# Patient Record
Sex: Male | Born: 1970 | Race: Black or African American | Hispanic: No | Smoking: Current every day smoker
Health system: Southern US, Community
[De-identification: ages and names within clinical notes are randomized; demographics above are authoritative.]

## PROBLEM LIST (undated history)

## (undated) DIAGNOSIS — K469 Unspecified abdominal hernia without obstruction or gangrene: Secondary | ICD-10-CM

## (undated) DIAGNOSIS — J45909 Unspecified asthma, uncomplicated: Secondary | ICD-10-CM

---

## 2015-04-19 ENCOUNTER — Encounter (HOSPITAL_COMMUNITY): Payer: Self-pay | Admitting: Emergency Medicine

## 2015-04-19 ENCOUNTER — Emergency Department (HOSPITAL_COMMUNITY)
Admission: EM | Admit: 2015-04-19 | Discharge: 2015-04-19 | Disposition: A | Discharge status: Home / Self Care | Payer: Self-pay | Attending: Emergency Medicine | Admitting: Emergency Medicine

## 2015-04-19 DIAGNOSIS — J45909 Unspecified asthma, uncomplicated: Secondary | ICD-10-CM

## 2015-04-19 DIAGNOSIS — J45901 Unspecified asthma with (acute) exacerbation: Secondary | ICD-10-CM | POA: Insufficient documentation

## 2015-04-19 HISTORY — DX: Unspecified asthma, uncomplicated: J45.909

## 2015-04-19 MED ORDER — ALBUTEROL SULFATE HFA 108 (90 BASE) MCG/ACT IN AERS
2.0000 | INHALATION_SPRAY | Freq: Once | RESPIRATORY_TRACT | Status: AC
  Administered 2015-04-19: 2 via RESPIRATORY_TRACT
  Filled 2015-04-19: qty 6.7

## 2015-04-19 MED ORDER — IPRATROPIUM-ALBUTEROL 0.5-2.5 (3) MG/3ML IN SOLN
3.0000 mL | Freq: Once | RESPIRATORY_TRACT | Status: AC
  Administered 2015-04-19: 3 mL via RESPIRATORY_TRACT
  Filled 2015-04-19: qty 3

## 2015-04-19 NOTE — Discharge Instructions (Signed)
1. Medications: albuterol inhaler, usual home medications 2. Treatment: rest, drink plenty of fluids 3. Follow Up: please followup with your primary doctor within the next week for discussion of your diagnoses and further evaluation after today's visit; if you do not have a primary care doctor use the resource guide provided to find one; please return to the ER for high fever, shortness of breath, chest pain, new or worsening symptoms   Asthma, Adult Asthma is a condition of the lungs in which the airways tighten and narrow. Asthma can make it hard to breathe. Asthma cannot be cured, but medicine and lifestyle changes can help control it. Asthma may be started (triggered) by:  Animal skin flakes (dander).  Dust.  Cockroaches.  Pollen.  Mold.  Smoke.  Cleaning products.  Hair sprays or aerosol sprays.  Paint fumes or strong smells.  Cold air, weather changes, and winds.  Crying or laughing hard.  Stress.  Certain medicines or drugs.  Foods, such as dried fruit, potato chips, and sparkling grape juice.  Infections or conditions (colds, flu).  Exercise.  Certain medical conditions or diseases.  Exercise or tiring activities. HOME CARE   Take medicine as told by your doctor.  Use a peak flow meter as told by your doctor. A peak flow meter is a tool that measures how well the lungs are working.  Record and keep track of the peak flow meter's readings.  Understand and use the asthma action plan. An asthma action plan is a written plan for taking care of your asthma and treating your attacks.  To help prevent asthma attacks:  Do not smoke. Stay away from secondhand smoke.  Change your heating and air conditioning filter often.  Limit your use of fireplaces and wood stoves.  Get rid of pests (such as roaches and mice) and their droppings.  Throw away plants if you see mold on them.  Clean your floors. Dust regularly. Use cleaning products that do not  smell.  Have someone vacuum when you are not home. Use a vacuum cleaner with a HEPA filter if possible.  Replace carpet with wood, tile, or vinyl flooring. Carpet can trap animal skin flakes and dust.  Use allergy-proof pillows, mattress covers, and box spring covers.  Wash bed sheets and blankets every week in hot water and dry them in a dryer.  Use blankets that are made of polyester or cotton.  Clean bathrooms and kitchens with bleach. If possible, have someone repaint the walls in these rooms with mold-resistant paint. Keep out of the rooms that are being cleaned and painted.  Wash hands often. GET HELP IF:  You have make a whistling sound when breaking (wheeze), have shortness of breath, or have a cough even if taking medicine to prevent attacks.  The colored mucus you cough up (sputum) is thicker than usual.  The colored mucus you cough up changes from clear or white to yellow, green, gray, or bloody.  You have problems from the medicine you are taking such as:  A rash.  Itching.  Swelling.  Trouble breathing.  You need reliever medicines more than 2-3 times a week.  Your peak flow measurement is still at 50-79% of your personal best after following the action plan for 1 hour.  You have a fever. GET HELP RIGHT AWAY IF:   You seem to be worse and are not responding to medicine during an asthma attack.  You are short of breath even at rest.  You get short  of breath when doing very little activity.  You have trouble eating, drinking, or talking.  You have chest pain.  You have a fast heartbeat.  Your lips or fingernails start to turn blue.  You are light-headed, dizzy, or faint.  Your peak flow is less than 50% of your personal best.   This information is not intended to replace advice given to you by your health care provider. Make sure you discuss any questions you have with your health care provider.   Document Released: 12/19/2007 Document Revised:  03/23/2015 Document Reviewed: 01/29/2013 Elsevier Interactive Patient Education 2016 [employer].    Emergency Department Resource Guide 1) Find a Doctor and Pay Out of Pocket Although you won't have to find out who is covered by your insurance plan, it is a good idea to ask around and get recommendations. You will then need to call the office and see if the doctor you have chosen will accept you as a new patient and what types of options they offer for patients who are self-pay. Some doctors offer discounts or will set up payment plans for their patients who do not have insurance, but you will need to ask so you aren't surprised when you get to your appointment.  2) Contact Your Local Health Department Not all health departments have doctors that can see patients for sick visits, but many do, so it is worth a call to see if yours does. If you don't know where your local health department is, you can check in your phone book. The CDC also has a tool to help you locate your state's health department, and many state websites also have listings of all of their local health departments.  3) Find a Walk-in Clinic If your illness is not likely to be very severe or complicated, you may want to try a walk in clinic. These are popping up all over the country in pharmacies, drugstores, and shopping centers. They're usually staffed by nurse practitioners or physician assistants that have been trained to treat common illnesses and complaints. They're usually fairly quick and inexpensive. However, if you have serious medical issues or chronic medical problems, these are probably not your best option.  No Primary Care Doctor: - Call Health Connect at  [phone] - they can help you locate a primary care doctor that  accepts your insurance, provides certain services, etc. - Physician Referral Service- [phone]  Chronic Pain Problems: Organization         Address  Phone   Notes  [facility]  [phone] Patients need to be referred by their primary care doctor.   Medication Assistance: Organization         Address  Phone   Notes  [facility] Medication [facility] [address] [place], [place] [address] [phone] --Must be a resident of [facility] -- Must have NO insurance coverage whatsoever (no Medicaid/ Medicare, etc.) -- The pt. MUST have a primary care doctor that directs their care regularly and follows them in the community   [facility]  [phone]   [employer]  [phone]    Agencies that provide inexpensive medical care: Organization         Address  Phone   Notes  [facility]  [phone]   [facility]    [phone]   [facility] [address] [place], [place] [address] [phone]   [facility] [place] [address] [place] [place] [phone]   Planned Parenthood    [phone]   [facility]    [phone]   [facility]  [address], [place] Phone:  [phone], Fax:  [phone] Hours of Operation:  9 am - 6 pm, M-F.  Also accepts Medicaid/Medicare and self-pay.  [facility]  [address], [place] Phone: [phone], Fax: [phone]. Hours of Operation:  8:30 am - 5:30 pm, M-F.  Also accepts Medicaid and self-pay.  [facility] [place] [address], [place] Phone: [phone]   [facility] [address] Natasha Bence [place], [place] [phone], Ext. [phone] Mondays & Thursdays: 7-9 AM.  First 15 patients are seen on a first come, first serve basis.    Medicaid-accepting [facility] Providers:  Organization         Address  Phone   Notes  [facility] [address],  [phone] Also accepts self-pay patients.  [facility] [address] Laurell Josephs [place],  [place]  [phone]   [facility] [address], [place] [phone]   [facility] [address], [place] [phone]   Renaye Rakers [address], [place]   [phone] Only accepts [place] Access [place] patients after they have their name applied to their card.   Self-Pay (no insurance) in [facility]:  Organization         Address  Phone   Notes  Sickle Cell Patients, [facility] Medicine [address] [place], [place] [phone]   [facility] [address] [place], [place] [phone]   [facility] [place]  [address], [place] [phone]   [facility]/Dr. Osei-Bonsu  [address] [place] or [address], [place] [phone] Phone number for both [place] and [place] locations is the same.  Urgent Medical and [facility] [address], [place] [phone]   [facility] [address] [place] or [address] [phone]   [facility] [address], [place] [phone], phone; [phone], fax Sees patients 1st and 3rd Saturday of every month.  Must not qualify for public or private insurance (i.e. Medicaid, Medicare, [place] Health Choice, Veterans' Benefits)  Household income should be no more than 200% of the poverty level The clinic cannot treat you if you are pregnant or think you are pregnant  Sexually transmitted diseases are not treated at the clinic.    Dental Care: Organization         Address  Phone  Notes  [facility] of [facility] [address] [place], [place] [phone] Accepts children up to age 69 who are enrolled in [place] or [place] Health Choice; pregnant women with a Medicaid card; and children who have applied for Medicaid or [place] Health  Choice, but were declined, whose parents can pay a reduced fee at time of service.  [facility] Department of [facility]  [address], [place] [phone] Accepts children up to age 80 who are enrolled in [place] or [place] Health Choice; pregnant women with a Medicaid card; and children  who have applied for Medicaid or [place] Health Choice, but were declined, whose parents can pay a reduced fee at time of service.  [facility]  [address] [place], [place] [phone] Patients are seen by appointment only. Walk-ins are not accepted. [facility] will see patients 35 years of age and older. Monday - Tuesday (8am-5pm) Most Wednesdays (8:30-5pm) $30 per visit, cash only  [facility] Adult Dental Access PROGRAM  [address], [facility] [phone] Patients are seen by appointment only. Walk-ins are not accepted. [facility] will see patients 58 years of age and older. One Wednesday Evening (Monthly: Volunteer Based).  $30 per visit, cash only  [employer]  [phone] for adults; Children under age 66, call Graduate Pediatric Dentistry at [phone]. Children aged 78-14, please call [phone] to request a pediatric application.  Dental services are provided in all areas of dental care including fillings, crowns and bridges, complete and partial dentures, implants, gum treatment, root canals, and extractions. Preventive care is also provided. Treatment is provided to both adults and children. Patients are selected via a lottery and there is often a waiting list.   [facility] [address], [place]  [phone] [email or web address]   [facility] [address] [place], [place] [phone], Ext. [phone] Second and Fourth Thursday of each month, opens at 6:30 AM; Clinic ends at 9 AM.  Patients are seen on a first-come first-served basis, and a limited number are seen during each  clinic.   [facility]  [address] [place] [address] Ether Griffins [place], [place] [phone]   Eligibility Requirements You must have lived in [place], [place], or [place] counties for at least the last three months.   You cannot be eligible for state or federal sponsored [place], including [employer], [place], or [employer].   You generally cannot be eligible for healthcare insurance through your employer.    How to apply: Eligibility screenings are held every Tuesday and Wednesday afternoon from 1:00 pm until 4:00 pm. You do not need an appointment for the interview!  [facility] [address] [place], [place] [phone]   [facility] Health Department  [phone]   [facility] Health Department  [phone]   [facility] Health Department  [phone]    Behavioral Health Resources in the Community: Intensive Outpatient Programs Organization         Address  Phone  Notes  [facility] [address] [place], [place] [phone]   [facility] Outpatient [address], [place], [place] [phone]   [facility] [address] [place], [place]  [phone]   [facility] Mental Health [address]  [place], [place] [phone] or [phone]   Substance Abuse Resources Organization         Address  Phone  Notes  [facility] Drug Services  [phone]   [facility]  [phone]   [facility] [place]  [phone]   Floydene Flock  [phone]   Residential & Outpatient Substance Abuse Program  [phone]   Psychological Services Organization         Address  Phone  Notes  [facility] Health  [phone]   [facility] Services  [phone]   [facility] Mental Health [address] [place] [phone] or [phone]    Mobile Crisis Teams Organization  Address  Phone  Notes  [facility], Mobile  Crisis Care Unit  [phone]   [facility]  [address] [place], [place]   [facility] [address], [place] [phone]    Self-Help/Support Groups Organization         Address  Phone             Notes  [place] [facility] [place] - variety of support groups  [place] Call for more information  Narcotics Anonymous (NA), [facility] [address], [employer] [place]  2 meetings at this location   Special educational needs teacher         Address  Phone  Notes  [facility] [place],    [place]  [phone]   [facility]  [address] [place] [phone], [place], [place]   [place]ael[place], [place] [phone] Admissions: 8am-3pm M-F  Incentives Substance [place] [phone],    [place], [place] [phone]   [facility] [address] [place], [place], [place]   [facility] [address],  [place], [place]   [facility] - Intensive Outpatient [address] Kristeen Mans [address], [place], [place]   [facility] [place]  [place], [place] [phone] or [phone]   [facility] ([facility]) [address], [place], [place] Accepts Medicaid  [facility] [place] [address]  [place] [phone] Substance Abuse/Addiction Treatment   [facility] Organization         Address  Phone  Notes  [facility]  [phone]   Domenic Schwab, PhD [address] Arlis Porta [place], [place]   [phone] or [phone]   [place], [place] [phone]   [facility] [address], [place], [place] [phone] Insurance/Medicaid/sponsorship through [facility] and Families [address]  [place]                                    [place], [place] [phone] [place] [address], [place] [phone]    Dr. Adele Schilder  [phone]   [facility] Dept. 1) [address], [place] 2) [place] 3)  [place] [address], [place] [phone]  [phone]   [place] [phone] or [phone] (After Hours)

## 2015-04-19 NOTE — ED Provider Notes (Signed)
CSN: [id number]     Arrival date & time 04/19/15  1609 History  By signing my name below, I, Jarvis Morgan, attest that this documentation has been prepared under the direction and in the presence of Glean Hess, [place]. Electronically Signed: Jarvis Morgan, ED Scribe. 04/19/2015. 5:43 PM.    Chief Complaint  Patient presents with  . Asthma    The history is provided by the patient. No language interpreter was used.    HPI Comments:  Omar Nelson is a 44 y.o. male with a PMH of asthma who presents to the Emergency Department complaining of asthma. He reports he was outside when his asthma attack started earlier today. He states this felt like his normal asthma attack, and reports he typically uses an albuterol inhaler for symptom relief. He reports he is currently out of his albuterol inhaler. He states he uses his inhaler daily. He reports he had a breathing treatment upon arrival to the ED, which provided significant symptom relief. He is currently asymptomatic. He denies recent illness, fever, chills, otalgia, nasal congestion, sore throat, chest pain, numbness, paresthesia, weakness, abdominal pain, nausea, vomiting, or diarrhea.     Past Medical History  Diagnosis Date  . Asthma    History reviewed. No pertinent past surgical history. No family history on file. Social History  Substance Use Topics  . Smoking status: Never Smoker   . Smokeless tobacco: None  . Alcohol Use: No      Review of Systems  Constitutional: Negative for fever and chills.  HENT: Negative for congestion and ear pain.   Respiratory: Positive for cough and shortness of breath.   Cardiovascular: Negative for chest pain.  Gastrointestinal: Negative for nausea, vomiting, abdominal pain and diarrhea.  Neurological: Negative for weakness and numbness.  All other systems reviewed and are negative.     Allergies  Review of patient's allergies indicates not on file.  Home Medications    Prior to Admission medications   Not on File   Triage Vitals: BP 132/84 mmHg  Pulse 74  Temp(Src) 98.1 F (36.7 C) (Oral)  Resp 20  SpO2 98%   Physical Exam  Constitutional: He is oriented to person, place, and time. He appears well-developed and well-nourished. No distress.  HENT:  Head: Normocephalic and atraumatic.  Right Ear: Tympanic membrane and external ear normal.  Left Ear: Tympanic membrane and external ear normal.  Nose: Nose normal.  Mouth/Throat: Uvula is midline and oropharynx is clear and moist. No posterior oropharyngeal edema or posterior oropharyngeal erythema.  Eyes: Conjunctivae and EOM are normal. Pupils are equal, round, and reactive to light. Right eye exhibits no discharge. Left eye exhibits no discharge. No scleral icterus.  Neck: Normal range of motion. Neck supple. No tracheal deviation present.  Cardiovascular: Normal rate, regular rhythm, normal heart sounds and intact distal pulses.   Pulmonary/Chest: Effort normal and breath sounds normal. No stridor. No respiratory distress. He has no wheezes. He has no rales.  Abdominal: Soft. Bowel sounds are normal. He exhibits no distension and no mass. There is no tenderness. There is no rebound and no guarding.  Musculoskeletal: Normal range of motion.  Neurological: He is alert and oriented to person, place, and time.  Skin: Skin is warm and dry. No rash noted. He is not diaphoretic. No erythema. No pallor.  Psychiatric: He has a normal mood and affect. His behavior is normal. Judgment and thought content normal.  Nursing note and vitals reviewed.   ED Course  Procedures (including  critical care time)  DIAGNOSTIC STUDIES: Oxygen Saturation is 98% on RA, normal by my interpretation.    COORDINATION OF CARE: 4:17 PM- Will order albuterol breathing treatment. Pt advised of plan for treatment and pt agrees.  5:42 PM-Will d/c pt home with albuterol inhaler to use daily. Will also provide pt with referral  to PCP. Recommended pt to return if symptoms worsen. Pt advised of plan for treatment and pt agrees.  Labs Review Labs Reviewed - No data to display  Imaging Review No results found.    EKG Interpretation None      MDM   Final diagnoses:  Asthma, unspecified asthma severity, uncomplicated    44 year old male presents with asthma. Reports asthma attack prior to arrival with associated dry cough and shortness of breath. Reports resolution of symptoms s/p breathing treatment. States his symptoms felt consistent with his typical asthma attack. Reports he ran out of his albuterol inhaler, and has not been using it for the past several days.   Patient is afebrile and well-appearing. Vital signs stable. No tachycardia. O2 sat 99% on RA. Heart RRR. Lungs clear to auscultation bilaterally with no wheezing. No respiratory distress. Abdomen soft, non-tender, non-distended.  Patient given albuterol inhaler. Feel patient is stable for discharge at this time. Do not feel any additional work-up is indicated, as the patient reports his symptoms were consistent with his typical asthma attack and resolved s/p treatment. He is currently asymptomatic. Patient to follow-up with PCP for management of asthma. Resource list given. Return precautions discussed at length. Patient is in agreement with plan.  BP 132/84 mmHg  Pulse 67  Temp(Src) 98.1 F (36.7 C) (Oral)  Resp 20  SpO2 99%      Mady Gemma, PA-C 04/20/15 1838  Lorre Nick, MD 04/21/15 [id number]

## 2015-04-19 NOTE — ED Notes (Signed)
Per pt, states asthma symptoms about a hour ago-doe not have inhaler

## 2015-09-04 ENCOUNTER — Emergency Department (HOSPITAL_COMMUNITY)
Admission: EM | Admit: 2015-09-04 | Discharge: 2015-09-04 | Disposition: A | Discharge status: Home / Self Care | Payer: Self-pay | Attending: Emergency Medicine | Admitting: Emergency Medicine

## 2015-09-04 ENCOUNTER — Encounter (HOSPITAL_COMMUNITY): Payer: Self-pay | Admitting: *Deleted

## 2015-09-04 DIAGNOSIS — J45901 Unspecified asthma with (acute) exacerbation: Secondary | ICD-10-CM | POA: Insufficient documentation

## 2015-09-04 DIAGNOSIS — J45909 Unspecified asthma, uncomplicated: Secondary | ICD-10-CM

## 2015-09-04 DIAGNOSIS — Z760 Encounter for issue of repeat prescription: Secondary | ICD-10-CM | POA: Insufficient documentation

## 2015-09-04 DIAGNOSIS — Z7951 Long term (current) use of inhaled steroids: Secondary | ICD-10-CM | POA: Insufficient documentation

## 2015-09-04 MED ORDER — ALBUTEROL SULFATE HFA 108 (90 BASE) MCG/ACT IN AERS
2.0000 | INHALATION_SPRAY | Freq: Once | RESPIRATORY_TRACT | Status: AC
  Administered 2015-09-04: 2 via RESPIRATORY_TRACT
  Filled 2015-09-04: qty 6.7

## 2015-09-04 MED ORDER — FLUTICASONE FUROATE-VILANTEROL 100-25 MCG/INH IN AEPB: 1.0000 | INHALATION_SPRAY | Freq: Every day | RESPIRATORY_TRACT | Status: DC

## 2015-09-04 MED ORDER — ALBUTEROL SULFATE HFA 108 (90 BASE) MCG/ACT IN AERS: 1.0000 | INHALATION_SPRAY | Freq: Four times a day (QID) | RESPIRATORY_TRACT | Status: DC | PRN

## 2015-09-04 MED ORDER — PREDNISONE 20 MG PO TABS: 40.0000 mg | ORAL_TABLET | Freq: Every day | ORAL | Status: DC

## 2015-09-04 MED ORDER — ALBUTEROL SULFATE (2.5 MG/3ML) 0.083% IN NEBU
INHALATION_SOLUTION | RESPIRATORY_TRACT | Status: AC
  Filled 2015-09-04: qty 6

## 2015-09-04 NOTE — Discharge Instructions (Signed)
Use inhalers as directed Return to ER for any new or worsening symptoms, any additional concerns.  Follow up with your primary provider in regards to today's visit. If you do not have a primary physician, see resource guide below for help.    Emergency Department Resource Guide  1) Find a Doctor and Pay Out of Pocket Although you won't have to find out who is covered by your insurance plan, it is a good idea to ask around and get recommendations. You will then need to call the office and see if the doctor you have chosen will accept you as a new patient and what types of options they offer for patients who are self-pay. Some doctors offer discounts or will set up payment plans for their patients who do not have insurance, but you will need to ask so you aren't surprised when you get to your appointment.  2) Contact Your Local Health Department Not all health departments have doctors that can see patients for sick visits, but many do, so it is worth a call to see if yours does. If you don't know where your local health department is, you can check in your phone book. The CDC also has a tool to help you locate your state's health department, and many state websites also have listings of all of their local health departments.  3) Find a Walk-in Clinic If your illness is not likely to be very severe or complicated, you may want to try a walk in clinic. These are popping up all over the country in pharmacies, drugstores, and shopping centers. They're usually staffed by nurse practitioners or physician assistants that have been trained to treat common illnesses and complaints. They're usually fairly quick and inexpensive. However, if you have serious medical issues or chronic medical problems, these are probably not your best option.  No Primary Care Doctor: - Call [facility] at  [phone]: they can help you locate a primary care doctor that  accepts your insurance, provides certain services,  etc. - Physician Referral Service: [phone]  Chronic Pain Problems: Organization         Address  Phone   Notes  [facility]  [phone] Patients need to be referred by their primary care doctor.   Medication Assistance: Organization         Address  Phone   Notes  [facility] Medication [facility] [address] [place] [place], [place] [address] [phone] --Must be a resident of [facility] -- Must have NO insurance coverage whatsoever (no Medicaid/ Medicare, etc.) -- The pt. MUST have a primary care doctor that directs their care regularly and follows them in the community   [facility]  [phone]   [employer]  [phone]    Agencies that provide inexpensive medical care: Organization         Address  Phone   Notes  [facility]  [phone]   [facility]    [phone]   [facility] [address] [place], [place] [address] [phone]   [facility] [address] [place] [place] [phone]   [facility]    [phone]   [facility]    [phone]   [facility]  [address], [place] Phone:  [phone], Fax:  [phone] Hours of Operation:  9 am - 6 pm, M-F.  Also  accepts Medicaid/Medicare and self-pay.  [facility]  [address], [place] Phone: [phone], Fax: [phone]. Hours of Operation:  8:30 am - 5:30 pm, M-F.  Also accepts Medicaid and self-pay.  [facility] [place] [address] [place] Phone: [phone]   [facility] [address] Natasha Bence [place], [place] [phone], Ext. [phone] Mondays & Thursdays: 7-9 AM.  First 15 patients are seen on a first come, first serve basis.    Medicaid-accepting [facility] Providers:  Organization         Address  Phone   Notes  [facility] [address], [place] [phone] Also accepts self-pay patients.  [facility] [address] Laurell Josephs [place], [place]  [phone]   [facility] [address], [place] [phone]   [facility] [address] [place] [phone]   Renaye Rakers [address], [place]   [phone] Only accepts [place] patients after they have their name applied to their card.   Self-Pay (no insurance) in [facility]:  Organization         Address  Phone   Notes  Sickle Cell Patients, [facility] [address] [place], [place] [phone]   [facility] [address] [place], [place] [phone]   [facility] [place]  [address], [place] [phone]   [facility]/Dr. Osei-Bonsu  [address] [place] or [address], [place] [phone] Phone number for both [place] and [facility] locations is the same.  [facility] [address] [place] [phone]   [facility] [address], [place] or [address] [phone]   [facility] [address], [place] [phone], phone; [phone], fax Sees patients 1st and 3rd Saturday of every month.  Must not qualify for public or private insurance (i.e. Medicaid, Medicare, [place] Health Choice, Veterans' Benefits)  Household income should be no more than 200% of the poverty level The clinic cannot treat you if you are pregnant or think you are pregnant  Sexually transmitted diseases are not treated at the clinic.

## 2015-09-04 NOTE — ED Provider Notes (Signed)
CSN: [id number]     Arrival date & time 09/04/15  1048 History  By signing my name below, I, Arianna Nassar, attest that this documentation has been prepared under the direction and in the presence of [employer], PA-C. Electronically Signed: Octavia Heir, ED Scribe. 09/04/2015. 11:11 AM.    Chief Complaint  Patient presents with  . Cough  . Medication Refill      The history is provided by the patient. No language interpreter was used.   HPI Comments: Omar Nelson is a 45 y.o. male who presents to the Emergency Department presenting with a medication refill for his asthma. Pt presents with dry cough and wheezing last night onset last night due to running out of his Breo steroid inhaler. He states he uses his Breo and his rescue inhaler to help with his symptoms but has not hasn't used them today. Denies fever.  Past Medical History  Diagnosis Date  . Asthma    History reviewed. No pertinent past surgical history. No family history on file. Social History  Substance Use Topics  . Smoking status: Never Smoker   . Smokeless tobacco: None  . Alcohol Use: No    Review of Systems  Constitutional: Negative for fever.  HENT: Negative for congestion.   Respiratory: Positive for cough and wheezing.   Cardiovascular: Negative for chest pain.      Allergies  Review of patient's allergies indicates no known allergies.  Home Medications   Prior to Admission medications   Medication Sig Start Date End Date Taking? Authorizing Provider  fluticasone furoate-vilanterol (BREO ELLIPTA) 100-25 MCG/INH AEPB Inhale 1 puff into the lungs daily. 09/04/15   Chase Picket Oswell Say, PA-C  predniSONE (DELTASONE) 20 MG tablet Take 2 tablets (40 mg total) by mouth daily. 09/04/15   Chase Picket Aury Scollard, PA-C   Triage vitals: BP 147/98 mmHg  Pulse 71  Temp(Src) 98.4 F (36.9 C) (Oral)  Resp 18  SpO2 96% Physical Exam  Constitutional: He is oriented to person, place, and time. He appears  well-developed and well-nourished.  HENT:  Head: Normocephalic and atraumatic.  Neck: Normal range of motion. Neck supple.  Cardiovascular: Normal rate, regular rhythm and normal heart sounds.   Pulmonary/Chest: Effort normal. No respiratory distress. He has wheezes (Fainte expiratory wheezing bilaterally). He has no rales.  Abdominal: Soft. He exhibits no distension. There is no tenderness.  Musculoskeletal: He exhibits no edema.  Neurological: He is alert and oriented to person, place, and time. Coordination normal.  Skin: Skin is warm and dry. No rash noted. He is not diaphoretic.  Psychiatric: He has a normal mood and affect.  Nursing note and vitals reviewed.   ED Course  Procedures  DIAGNOSTIC STUDIES: Oxygen Saturation is 96% on RA, normal by my interpretation.  COORDINATION OF CARE:  11:10 AM Discussed treatment plan which includes Brio inhaler and albuterol inhaler with pt at bedside and pt agreed to plan.  Labs Review Labs Reviewed - No data to display  Imaging Review No results found. I have personally reviewed and evaluated these images and lab results as part of my medical decision-making.   EKG Interpretation None      MDM   Final diagnoses:  Asthma, unspecified asthma severity, uncomplicated   Omar Nelson presents for medication refill of asthma inhalers. Mild wheezing on exam - patient declines neb treatment stating that "it doesn't work" and he "will not take it" Patient given rx for ICS inhaler and albuterol along with steroid burst. PCP resource  guide provided. Return precautions discussed.   I personally performed the services described in this documentation, which was scribed in my presence. The recorded information has been reviewed and is accurate.  [facility] Trevonte Ashkar, PA-C 09/04/15 1128  Arby Barrette, MD 09/04/15 [phone]

## 2015-09-04 NOTE — ED Notes (Signed)
Pt reports being out of his Brio for his asthma. Pt reports cough/wheezing since last night. Pt offered albuterol in triage but refused

## 2015-09-04 NOTE — ED Notes (Signed)
Pt walked to FT 3 without difficulty. "I can walk!"

## 2015-10-05 ENCOUNTER — Emergency Department (HOSPITAL_COMMUNITY)
Admission: EM | Admit: 2015-10-05 | Discharge: 2015-10-05 | Disposition: A | Discharge status: Home / Self Care | Payer: Self-pay | Attending: Emergency Medicine | Admitting: Emergency Medicine

## 2015-10-05 ENCOUNTER — Encounter (HOSPITAL_COMMUNITY): Payer: Self-pay | Admitting: Family Medicine

## 2015-10-05 DIAGNOSIS — F419 Anxiety disorder, unspecified: Secondary | ICD-10-CM | POA: Insufficient documentation

## 2015-10-05 DIAGNOSIS — J45991 Cough variant asthma: Secondary | ICD-10-CM

## 2015-10-05 DIAGNOSIS — Z760 Encounter for issue of repeat prescription: Secondary | ICD-10-CM

## 2015-10-05 DIAGNOSIS — Z7952 Long term (current) use of systemic steroids: Secondary | ICD-10-CM | POA: Insufficient documentation

## 2015-10-05 DIAGNOSIS — Z79899 Other long term (current) drug therapy: Secondary | ICD-10-CM | POA: Insufficient documentation

## 2015-10-05 MED ORDER — LORATADINE 10 MG PO TABS: 10.0000 mg | ORAL_TABLET | Freq: Every day | ORAL | Status: DC

## 2015-10-05 MED ORDER — FLUTICASONE FUROATE-VILANTEROL 100-25 MCG/INH IN AEPB: 1.0000 | INHALATION_SPRAY | Freq: Every day | RESPIRATORY_TRACT | Status: DC

## 2015-10-05 MED ORDER — ALBUTEROL SULFATE HFA 108 (90 BASE) MCG/ACT IN AERS: 1.0000 | INHALATION_SPRAY | Freq: Four times a day (QID) | RESPIRATORY_TRACT | Status: AC | PRN

## 2015-10-05 MED ORDER — ALBUTEROL SULFATE (2.5 MG/3ML) 0.083% IN NEBU
5.0000 mg | INHALATION_SOLUTION | Freq: Once | RESPIRATORY_TRACT | Status: AC
  Administered 2015-10-05: 5 mg via RESPIRATORY_TRACT

## 2015-10-05 MED ORDER — ALBUTEROL SULFATE (2.5 MG/3ML) 0.083% IN NEBU
INHALATION_SOLUTION | RESPIRATORY_TRACT | Status: AC
  Filled 2015-10-05: qty 3

## 2015-10-05 NOTE — ED Notes (Signed)
Pt being very rude sts "I just need my asthma". Refusing temperature.

## 2015-10-05 NOTE — ED Notes (Signed)
Patient being very rude and aggressive towards staff stating "I don't need none of that. I just need my asthma. That's all I need"; refused temperature being taken and vitals until instructed that the doctor will need to know what his vitals are to treat him; patient complied to vitals only

## 2015-10-05 NOTE — Discharge Instructions (Signed)
Asthma, Adult Asthma is a recurring condition in which the airways tighten and narrow. Asthma can make it difficult to breathe. It can cause coughing, wheezing, and shortness of breath. Asthma episodes, also called asthma attacks, range from minor to life-threatening. Asthma cannot be cured, but medicines and lifestyle changes can help control it. CAUSES Asthma is believed to be caused by inherited (genetic) and environmental factors, but its exact cause is unknown. Asthma may be triggered by allergens, lung infections, or irritants in the air. Asthma triggers are different for each person. Common triggers include:   Animal dander.  Dust mites.  Cockroaches.  Pollen from trees or grass.  Mold.  Smoke.  Air pollutants such as dust, household cleaners, hair sprays, aerosol sprays, paint fumes, strong chemicals, or strong odors.  Cold air, weather changes, and winds (which increase molds and pollens in the air).  Strong emotional expressions such as crying or laughing hard.  Stress.  Certain medicines (such as aspirin) or types of drugs (such as beta-blockers).  Sulfites in foods and drinks. Foods and drinks that may contain sulfites include dried fruit, potato chips, and sparkling grape juice.  Infections or inflammatory conditions such as the flu, a cold, or an inflammation of the nasal membranes (rhinitis).  Gastroesophageal reflux disease (GERD).  Exercise or strenuous activity. SYMPTOMS Symptoms may occur immediately after asthma is triggered or many hours later. Symptoms include:  Wheezing.  Excessive nighttime or early morning coughing.  Frequent or severe coughing with a common cold.  Chest tightness.  Shortness of breath. DIAGNOSIS  The diagnosis of asthma is made by a review of your medical history and a physical exam. Tests may also be performed. These may include:  Lung function studies. These tests show how much air you breathe in and out.  Allergy  tests.  Imaging tests such as X-rays. TREATMENT  Asthma cannot be cured, but it can usually be controlled. Treatment involves identifying and avoiding your asthma triggers. It also involves medicines. There are 2 classes of medicine used for asthma treatment:   Controller medicines. These prevent asthma symptoms from occurring. They are usually taken every day.  Reliever or rescue medicines. These quickly relieve asthma symptoms. They are used as needed and provide short-term relief. Your health care provider will help you create an asthma action plan. An asthma action plan is a written plan for managing and treating your asthma attacks. It includes a list of your asthma triggers and how they may be avoided. It also includes information on when medicines should be taken and when their dosage should be changed. An action plan may also involve the use of a device called a peak flow meter. A peak flow meter measures how well the lungs are working. It helps you monitor your condition. HOME CARE INSTRUCTIONS   Take medicines only as directed by your health care provider. Speak with your health care provider if you have questions about how or when to take the medicines.  Use a peak flow meter as directed by your health care provider. Record and keep track of readings.  Understand and use the action plan to help minimize or stop an asthma attack without needing to seek medical care.  Control your home environment in the following ways to help prevent asthma attacks:  Do not smoke. Avoid being exposed to secondhand smoke.  Change your heating and air conditioning filter regularly.  Limit your use of fireplaces and wood stoves.  Get rid of pests (such as roaches   and mice) and their droppings.  Throw away plants if you see mold on them.  Clean your floors and dust regularly. Use unscented cleaning products.  Try to have someone else vacuum for you regularly. Stay out of rooms while they are  being vacuumed and for a short while afterward. If you vacuum, use a dust mask from a hardware store, a double-layered or microfilter vacuum cleaner bag, or a vacuum cleaner with a HEPA filter.  Replace carpet with wood, tile, or vinyl flooring. Carpet can trap dander and dust.  Use allergy-proof pillows, mattress covers, and box spring covers.  Wash bed sheets and blankets every week in hot water and dry them in a dryer.  Use blankets that are made of polyester or cotton.  Clean bathrooms and kitchens with bleach. If possible, have someone repaint the walls in these rooms with mold-resistant paint. Keep out of the rooms that are being cleaned and painted.  Wash hands frequently. SEEK MEDICAL CARE IF:   You have wheezing, shortness of breath, or a cough even if taking medicine to prevent attacks.  The colored mucus you cough up (sputum) is thicker than usual.  Your sputum changes from clear or white to yellow, green, gray, or bloody.  You have any problems that may be related to the medicines you are taking (such as a rash, itching, swelling, or trouble breathing).  You are using a reliever medicine more than 2-3 times per week.  Your peak flow is still at 50-79% of your personal best after following your action plan for 1 hour.  You have a fever. SEEK IMMEDIATE MEDICAL CARE IF:   You seem to be getting worse and are unresponsive to treatment during an asthma attack.  You are short of breath even at rest.  You get short of breath when doing very little physical activity.  You have difficulty eating, drinking, or talking due to asthma symptoms.  You develop chest pain.  You develop a fast heartbeat.  You have a bluish color to your lips or fingernails.  You are light-headed, dizzy, or faint.  Your peak flow is less than 50% of your personal best.   This information is not intended to replace advice given to you by your health care provider. Make sure you discuss any  questions you have with your health care provider.   Document Released: 07/02/2005 Document Revised: 03/23/2015 Document Reviewed: 01/29/2013 Elsevier Interactive Patient Education 2016 Elsevier Inc.  

## 2015-10-05 NOTE — ED Provider Notes (Signed)
CSN: [id number]     Arrival date & time 10/05/15  1145 History   First MD Initiated Contact with Patient 10/05/15 1241     Chief Complaint  Patient presents with  . Asthma   HPI Comments: 45 year old male presents for med refill. He states he has had asthma for 25 years when a "doctor did a procedure on him". Pt states he has been getting his med refills here in the ED. He also states he has seasonal allergies which causes worsening of his asthma symptoms. He states he has been coughing, wheezing, and been SOB. Pt has been aggressive with staff in the waiting room, refusing breathing treatments, and refusing vitals. He has been talking in full sentences. He denies recent illness, fever, chills, URI symptoms, chest pain, abdominal pain. His last visit was 2/19 when also refused treatment until he received an albuterol inhaler.   The history is provided by the patient.    Past Medical History  Diagnosis Date  . Asthma    History reviewed. No pertinent past surgical history. History reviewed. No pertinent family history. Social History  Substance Use Topics  . Smoking status: Never Smoker   . Smokeless tobacco: None  . Alcohol Use: No    Review of Systems  Constitutional: Negative for fever and chills.  HENT: Negative for congestion.   Respiratory: Positive for cough, shortness of breath and wheezing.   Cardiovascular: Negative for chest pain.  Neurological: Positive for dizziness.    Allergies  Review of patient's allergies indicates no known allergies.  Home Medications   Prior to Admission medications   Medication Sig Start Date End Date Taking? Authorizing Provider  albuterol (PROVENTIL HFA;VENTOLIN HFA) 108 (90 Base) MCG/ACT inhaler Inhale 1-2 puffs into the lungs every 6 (six) hours as needed for wheezing or shortness of breath. 10/05/15   Bethel Born, PA-C  fluticasone furoate-vilanterol (BREO ELLIPTA) 100-25 MCG/INH AEPB Inhale 1 puff into the lungs daily. 10/05/15    Bethel Born, PA-C  loratadine (CLARITIN) 10 MG tablet Take 1 tablet (10 mg total) by mouth daily. 10/05/15   Bethel Born, PA-C  predniSONE (DELTASONE) 20 MG tablet Take 2 tablets (40 mg total) by mouth daily. 09/04/15   Jaime Pilcher Ward, PA-C   BP 130/91 mmHg  Pulse 84  Temp(Src)   Resp 18  SpO2 95%   Physical Exam  Constitutional: He is oriented to person, place, and time. He appears well-developed and well-nourished. No distress.  HENT:  Head: Normocephalic and atraumatic.  Eyes: Pupils are equal, round, and reactive to light. Right eye exhibits no discharge. Left eye exhibits no discharge. No scleral icterus.  Pulmonary/Chest: Effort normal. No accessory muscle usage. No tachypnea. No respiratory distress. He has no decreased breath sounds. He has no wheezes. He has no rhonchi. He has no rales.  Neurological: He is alert and oriented to person, place, and time.  Skin: Skin is warm and dry.  Psychiatric: His speech is normal. His mood appears anxious. His affect is angry, labile and inappropriate. He is agitated, aggressive, hyperactive and combative. Thought content is delusional. He expresses impulsivity and inappropriate judgment. He expresses no homicidal and no suicidal ideation.    ED Course  Procedures (including critical care time)   MDM   Final diagnoses:  Cough variant asthma  Medication refill   45 year old male presents for med refill. Pt is in NAD; he is yelling at staff in full sentences. No retractions noted. Pt  offered nebulizer treatments multiple times despite clear lung sounds and pt refused saying it was not the same medicine. Pt was provided a prescription for all of his medicines but pt is refusing them saying he wants them handed to him here in the ED because he does not have money to afford meds. Security was called to escort patient out. Once pt was informed he would not be given medication in the ED he is complaining of nausea and dizziness and  is repeatedly throwing himself on the floor but continuing to refuse treatment.    Bethel BornKelly Marie Hadli Vandemark, PA-C 10/05/15 1409  Bethel BornKelly Marie Jarika Robben, PA-C 10/05/15 1410  Blane OharaJoshua Zavitz, MD 10/05/15 [phone]

## 2015-10-05 NOTE — ED Notes (Signed)
Patient became angry when I went to give him discharge papers.  Patient states "my nausea makes me dizzy".   Patient was in NAD.  Patient talking in full sentences and then states "I can't afford my medicine.  So I am going to pass out when I get outside".  Patient refused to leave.  Patient yelling at staff.   Security called to help with patient.   Patient threw himself on the floor stating "I cant afford it, I have to get that Dr.'s name so I can sue".  I offered, as well as two Tax advisersecurity officers, and two techs, to help patient up.  Patient refused.   Patient threw himself on the floor again.   No injuries.   This WAS NOT a FALL.  Patient continued to yell all the way out with security.

## 2015-10-05 NOTE — ED Notes (Signed)
Pt here for asthma sts he needs a breathing treatment. Pt speaking in full sentences.

## 2016-02-04 ENCOUNTER — Emergency Department (HOSPITAL_COMMUNITY): Payer: Self-pay

## 2016-02-04 ENCOUNTER — Inpatient Hospital Stay (HOSPITAL_COMMUNITY)
Admission: EM | Admit: 2016-02-04 | Discharge: 2016-02-07 | DRG: 885 | Disposition: A | Discharge status: Home / Self Care | Payer: Self-pay | Attending: Internal Medicine | Admitting: Internal Medicine

## 2016-02-04 ENCOUNTER — Encounter (HOSPITAL_COMMUNITY): Payer: Self-pay | Admitting: Emergency Medicine

## 2016-02-04 DIAGNOSIS — N19 Unspecified kidney failure: Secondary | ICD-10-CM

## 2016-02-04 DIAGNOSIS — M6282 Rhabdomyolysis: Secondary | ICD-10-CM | POA: Diagnosis present

## 2016-02-04 DIAGNOSIS — F12950 Cannabis use, unspecified with psychotic disorder with delusions: Secondary | ICD-10-CM

## 2016-02-04 DIAGNOSIS — E872 Acidosis: Secondary | ICD-10-CM | POA: Diagnosis present

## 2016-02-04 DIAGNOSIS — F29 Unspecified psychosis not due to a substance or known physiological condition: Secondary | ICD-10-CM | POA: Diagnosis present

## 2016-02-04 DIAGNOSIS — J45909 Unspecified asthma, uncomplicated: Secondary | ICD-10-CM | POA: Diagnosis present

## 2016-02-04 DIAGNOSIS — F1220 Cannabis dependence, uncomplicated: Secondary | ICD-10-CM | POA: Diagnosis present

## 2016-02-04 DIAGNOSIS — F23 Brief psychotic disorder: Secondary | ICD-10-CM | POA: Diagnosis present

## 2016-02-04 DIAGNOSIS — F24 Shared psychotic disorder: Principal | ICD-10-CM | POA: Diagnosis present

## 2016-02-04 DIAGNOSIS — G9340 Encephalopathy, unspecified: Secondary | ICD-10-CM | POA: Diagnosis present

## 2016-02-04 DIAGNOSIS — F17200 Nicotine dependence, unspecified, uncomplicated: Secondary | ICD-10-CM | POA: Diagnosis present

## 2016-02-04 DIAGNOSIS — N179 Acute kidney failure, unspecified: Secondary | ICD-10-CM | POA: Diagnosis present

## 2016-02-04 LAB — CBC WITH DIFFERENTIAL/PLATELET
BASOS ABS: 0 10*3/uL (ref 0.0–0.1)
BASOS PCT: 0 %
EOS ABS: 0 10*3/uL (ref 0.0–0.7)
EOS PCT: 0 %
HEMATOCRIT: 44.4 % (ref 39.0–52.0)
Hemoglobin: 15 g/dL (ref 13.0–17.0)
LYMPHS ABS: 1.1 10*3/uL (ref 0.7–4.0)
Lymphocytes Relative: 10 %
MCH: 31.5 pg (ref 26.0–34.0)
MCHC: 33.8 g/dL (ref 30.0–36.0)
MCV: 93.3 fL (ref 78.0–100.0)
MONOS PCT: 8 %
Monocytes Absolute: 0.9 10*3/uL (ref 0.1–1.0)
NEUTROS PCT: 82 %
Neutro Abs: 9.4 10*3/uL — ABNORMAL HIGH (ref 1.7–7.7)
PLATELETS: 313 10*3/uL (ref 150–400)
RBC: 4.76 MIL/uL (ref 4.22–5.81)
RDW: 13 % (ref 11.5–15.5)
WBC: 11.4 10*3/uL — AB (ref 4.0–10.5)

## 2016-02-04 LAB — BLOOD GAS, VENOUS
ACID-BASE DEFICIT: 13.5 mmol/L — AB (ref 0.0–2.0)
Bicarbonate: 11 mEq/L — ABNORMAL LOW (ref 20.0–24.0)
Drawn by: 36787
O2 Saturation: 90.3 %
PCO2 VEN: 23 mmHg — AB (ref 45.0–50.0)
PO2 VEN: 71.5 mmHg — AB (ref 31.0–45.0)
Patient temperature: 98.6
TCO2: 9.9 mmol/L (ref 0–100)
pH, Ven: 7.302 — ABNORMAL HIGH (ref 7.250–7.300)

## 2016-02-04 LAB — URINALYSIS, ROUTINE W REFLEX MICROSCOPIC
GLUCOSE, UA: NEGATIVE mg/dL
Ketones, ur: NEGATIVE mg/dL
LEUKOCYTES UA: NEGATIVE
Nitrite: NEGATIVE
PH: 5 (ref 5.0–8.0)
PROTEIN: 30 mg/dL — AB
Specific Gravity, Urine: 1.018 (ref 1.005–1.030)

## 2016-02-04 LAB — COMPREHENSIVE METABOLIC PANEL
ALT: 39 U/L (ref 17–63)
AST: 136 U/L — ABNORMAL HIGH (ref 15–41)
Albumin: 5.8 g/dL — ABNORMAL HIGH (ref 3.5–5.0)
Alkaline Phosphatase: 67 U/L (ref 38–126)
Anion gap: 26 — ABNORMAL HIGH (ref 5–15)
BILIRUBIN TOTAL: 2.1 mg/dL — AB (ref 0.3–1.2)
BUN: 64 mg/dL — AB (ref 6–20)
CHLORIDE: 99 mmol/L — AB (ref 101–111)
CO2: 14 mmol/L — ABNORMAL LOW (ref 22–32)
CREATININE: 5.57 mg/dL — AB (ref 0.61–1.24)
Calcium: 9.9 mg/dL (ref 8.9–10.3)
GFR, EST AFRICAN AMERICAN: 13 mL/min — AB (ref >60)
GFR, EST NON AFRICAN AMERICAN: 11 mL/min — AB (ref >60)
Glucose, Bld: 103 mg/dL — ABNORMAL HIGH (ref 65–99)
POTASSIUM: 4.7 mmol/L (ref 3.5–5.1)
Sodium: 139 mmol/L (ref 135–145)
TOTAL PROTEIN: 10 g/dL — AB (ref 6.5–8.1)

## 2016-02-04 LAB — RAPID URINE DRUG SCREEN, HOSP PERFORMED
Amphetamines: NOT DETECTED
BENZODIAZEPINES: NOT DETECTED
Barbiturates: NOT DETECTED
COCAINE: NOT DETECTED
Opiates: NOT DETECTED

## 2016-02-04 LAB — URINE MICROSCOPIC-ADD ON
Bacteria, UA: NONE SEEN
RBC / HPF: NONE SEEN RBC/hpf (ref 0–5)
WBC, UA: NONE SEEN WBC/hpf (ref 0–5)

## 2016-02-04 LAB — ETHANOL

## 2016-02-04 LAB — SALICYLATE LEVEL: Salicylate Lvl: <4 mg/dL (ref 2.8–30.0)

## 2016-02-04 LAB — ACETAMINOPHEN LEVEL

## 2016-02-04 LAB — CK: CK TOTAL: 5185 U/L — AB (ref 49–397)

## 2016-02-04 MED ORDER — SODIUM CHLORIDE 0.9 % IV BOLUS (SEPSIS)
1000.0000 mL | Freq: Once | INTRAVENOUS | Status: AC
Start: 2016-02-04 — End: 2016-02-04
  Administered 2016-02-04: 1000 mL via INTRAVENOUS

## 2016-02-04 MED ORDER — ZIPRASIDONE MESYLATE 20 MG IM SOLR
20.0000 mg | Freq: Once | INTRAMUSCULAR | Status: AC
  Administered 2016-02-04: 20 mg via INTRAMUSCULAR
  Filled 2016-02-04: qty 20

## 2016-02-04 MED ORDER — STERILE WATER FOR INJECTION IJ SOLN
INTRAMUSCULAR | Status: AC
  Administered 2016-02-04: 1.2 mL
  Filled 2016-02-04: qty 10
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
## 2016-02-04 NOTE — ED Notes (Signed)
Pt to CT

## 2016-02-04 NOTE — ED Notes (Signed)
CT delayed due to patient behavior. Will notify CT when patient is ready for imaging.

## 2016-02-04 NOTE — ED Notes (Signed)
Patient extremely combative. Patient given Geodon, assisted by NT Porfirio Mylar, and 3 [employer] officers. Patient is talking to people who are not in the room. Patient fixated on the number 7.

## 2016-02-04 NOTE — ED Notes (Signed)
Patient was cooperative for oral temp. 98.6

## 2016-02-04 NOTE — ED Provider Notes (Signed)
CSN: [id number]     Arrival date & time 02/04/16  1849 History   First MD Initiated Contact with Patient 02/04/16 1906     Chief Complaint  Patient presents with  . Psychosis      (Consider location/radiation/quality/duration/timing/severity/associated sxs/prior Treatment) HPI  45 year old male brought in by police after he was found running in between cars in the middle of the street. Officer states that he first heard the patient yelling and he ran up to the officer yelling "help, help". Patient is refusing to answer any questions at this time. He keeps telling me that we are recording him. He states he already has the pertinent information recorded so he does not deal the need to repeat it. Apparently he was convinced that people in the passing cars were going to kill him. He will not tell me who is out to kill him. He mentions a woman named Meriam Sprague, but will not let me talk to her or tell me why she is important. No known psychiatric disease.   Past Medical History  Diagnosis Date  . Asthma    No past surgical history on file. No family history on file. Social History  Substance Use Topics  . Smoking status: Never Smoker   . Smokeless tobacco: Not on file  . Alcohol Use: No    Review of Systems  Unable to perform ROS: Psychiatric disorder      Allergies  Review of patient's allergies indicates no known allergies.  Home Medications   Prior to Admission medications   Medication Sig Start Date End Date Taking? Authorizing Provider  albuterol (PROVENTIL HFA;VENTOLIN HFA) 108 (90 Base) MCG/ACT inhaler Inhale 1-2 puffs into the lungs every 6 (six) hours as needed for wheezing or shortness of breath. 10/05/15   Bethel Born, PA-C  fluticasone furoate-vilanterol (BREO ELLIPTA) 100-25 MCG/INH AEPB Inhale 1 puff into the lungs daily. 10/05/15   Bethel Born, PA-C  loratadine (CLARITIN) 10 MG tablet Take 1 tablet (10 mg total) by mouth daily. 10/05/15   Bethel Born,  PA-C  predniSONE (DELTASONE) 20 MG tablet Take 2 tablets (40 mg total) by mouth daily. 09/04/15   Chase Picket Ward, PA-C   Temp(Src) 98.6 F (37 C) (Oral) Physical Exam  Constitutional: He appears well-developed and well-nourished.  Patient will not answer questions. No slurred speech. Does not allow me to examine him.  HENT:  Head: Normocephalic and atraumatic.  Right Ear: External ear normal.  Left Ear: External ear normal.  Nose: Nose normal.  Eyes: Right eye exhibits no discharge. Left eye exhibits no discharge.  Cardiovascular: Tachycardia present.   Pulmonary/Chest: Effort normal.  Abdominal: He exhibits no distension.  Musculoskeletal: He exhibits no edema.  Neurological: He is alert. He is disoriented.  He is unsure of the day of the week (off by 1 day). Will not answer further orientation questions  Skin: Skin is warm. He is diaphoretic.  Psychiatric: He is agitated.  Nursing note and vitals reviewed.   ED Course  Procedures (including critical care time) Labs Review Labs Reviewed  COMPREHENSIVE METABOLIC PANEL - Abnormal; Notable for the following:       Result Value   Chloride 99 (*)    CO2 14 (*)    Glucose, Bld 103 (*)    BUN 64 (*)    Creatinine, Ser 5.57 (*)    Total Protein 10.0 (*)    Albumin 5.8 (*)    AST 136 (*)    Total Bilirubin 2.1 (*)  GFR calc non Af Amer 11 (*)    GFR calc Af Amer 13 (*)    Anion gap 26 (*)    All other components within normal limits  ACETAMINOPHEN LEVEL - Abnormal; Notable for the following:    Acetaminophen (Tylenol), Serum <10 (*)    All other components within normal limits  CBC WITH DIFFERENTIAL/PLATELET - Abnormal; Notable for the following:    WBC 11.4 (*)    Neutro Abs 9.4 (*)    All other components within normal limits  URINE RAPID DRUG SCREEN, HOSP PERFORMED - Abnormal; Notable for the following:    Tetrahydrocannabinol RESULTS UNAVAILABLE DUE TO INTERFERING SUBSTANCE (*)    All other components within  normal limits  URINALYSIS, ROUTINE W REFLEX MICROSCOPIC (NOT AT [facility] - Abnormal; Notable for the following:    APPearance CLOUDY (*)    Hgb urine dipstick LARGE (*)    Bilirubin Urine SMALL (*)    Protein, ur 30 (*)    All other components within normal limits  BLOOD GAS, VENOUS - Abnormal; Notable for the following:    pH, Ven 7.302 (*)    pCO2, Ven 23.0 (*)    pO2, Ven 71.5 (*)    Bicarbonate 11.0 (*)    Acid-base deficit 13.5 (*)    All other components within normal limits  CK - Abnormal; Notable for the following:    Total CK 5,185 (*)    All other components within normal limits  URINE MICROSCOPIC-ADD ON - Abnormal; Notable for the following:    Squamous Epithelial / LPF 0-5 (*)    All other components within normal limits  COMPREHENSIVE METABOLIC PANEL - Abnormal; Notable for the following:    CO2 16 (*)    Glucose, Bld 108 (*)    BUN 48 (*)    Creatinine, Ser 2.27 (*)    AST 155 (*)    Total Bilirubin 1.9 (*)    GFR calc non Af Amer 33 (*)    GFR calc Af Amer 38 (*)    All other components within normal limits  URINE RAPID DRUG SCREEN, HOSP PERFORMED - Abnormal; Notable for the following:    Tetrahydrocannabinol POSITIVE (*)    All other components within normal limits  CK - Abnormal; Notable for the following:    Total CK 8,996 (*)    All other components within normal limits  ETHANOL  SALICYLATE LEVEL  CBC  URINE RAPID DRUG SCREEN, HOSP PERFORMED    Imaging Review Ct Head Wo Contrast  Result Date: 02/04/2016 CLINICAL DATA:  Altered mental status.  Patient combative. EXAM: CT HEAD WITHOUT CONTRAST TECHNIQUE: Contiguous axial images were obtained from the base of the skull through the vertex without intravenous contrast. COMPARISON:  None. FINDINGS: Ventricles, cisterns and other CSF spaces are within normal. There is no mass, mass effect, shift of midline structures or acute hemorrhage. Remaining bones and soft tissues are within normal. IMPRESSION: No acute  intracranial findings. Electronically Signed   By: Elberta Fortis M.D.   On: 02/04/2016 20:53   Dg Chest Portable 1 View  Result Date: 02/04/2016 CLINICAL DATA:  Patient with altered mental status. EXAM: PORTABLE CHEST 1 VIEW COMPARISON:  None. FINDINGS: Monitoring leads overlie the patient. Normal cardiac and mediastinal contours. No consolidative pulmonary opacities. No pleural effusion or pneumothorax. IMPRESSION: No acute cardiopulmonary process. Electronically Signed   By: Annia Belt M.D.   On: 02/04/2016 19:45   I have personally reviewed and evaluated these images and  lab results as part of my medical decision-making.   EKG Interpretation  Date/Time:  Saturday February 04 2016 19:14:49 EDT Ventricular Rate:  109 PR Interval:    QRS Duration: 86 QT Interval:  348 QTC Calculation: 469 R Axis:   53 Text Interpretation:  Sinus tachycardia Probable left atrial enlargement Baseline wander in lead(s) II III aVF V3 No old tracing to compare Confirmed by Scot Shiraishi MD, Ladarrion Telfair [id number]) on 02/04/2016 8:41:40 PM       MDM   Final diagnoses:  None  Acute Psychosis Acute Renal Failure Rhabdomyolysis  Patient presents acutely altered and psychotic. IVC'd by me as he does not appear to have decision making capacity and is aggressive. Likely drug induced vs psychotic break. Given no prior history, workup obtained. CT head negative. No fevers. Calmer after geodon and does not have a stiff neck or fever to suggest CNS infection. Does have renal failure with Cr of 5, no known history. Likely from running in heat and rhabdo given elevated CK. Given IV fluids, admit to hospitalist. No electrolyte disturbances.    Pricilla Loveless, MD 02/05/16 1355

## 2016-02-04 NOTE — ED Notes (Signed)
Unable to obtain accurate BP as patient remains combative and uncooperative. Patient complaining about handcuffs, states they are too tight. Patient pulling against handcuffs. Handcuffs were removed for assessment of skin, then replaced. Patient with ability to move hands. Patient repositioned in bed.

## 2016-02-04 NOTE — ED Notes (Signed)
Forensic restraints removed. Patient calm and resting at this time.

## 2016-02-04 NOTE — ED Notes (Signed)
Attempted to call report, RN not available at this time

## 2016-02-05 ENCOUNTER — Encounter (HOSPITAL_COMMUNITY): Payer: Self-pay | Admitting: Internal Medicine

## 2016-02-05 ENCOUNTER — Inpatient Hospital Stay (HOSPITAL_COMMUNITY): Payer: Self-pay

## 2016-02-05 DIAGNOSIS — F1220 Cannabis dependence, uncomplicated: Secondary | ICD-10-CM

## 2016-02-05 DIAGNOSIS — F29 Unspecified psychosis not due to a substance or known physiological condition: Secondary | ICD-10-CM | POA: Diagnosis present

## 2016-02-05 DIAGNOSIS — F12950 Cannabis use, unspecified with psychotic disorder with delusions: Secondary | ICD-10-CM

## 2016-02-05 DIAGNOSIS — N179 Acute kidney failure, unspecified: Secondary | ICD-10-CM

## 2016-02-05 LAB — CK
Total CK: 8996 U/L — ABNORMAL HIGH (ref 49–397)
Total CK: 10374 U/L — ABNORMAL HIGH (ref 49–397)
Total CK: 10725 U/L — ABNORMAL HIGH (ref 49–397)

## 2016-02-05 LAB — COMPREHENSIVE METABOLIC PANEL
ALBUMIN: 4.4 g/dL (ref 3.5–5.0)
ALK PHOS: 50 U/L (ref 38–126)
ALT: 38 U/L (ref 17–63)
AST: 155 U/L — AB (ref 15–41)
Anion gap: 11 (ref 5–15)
BUN: 48 mg/dL — ABNORMAL HIGH (ref 6–20)
CALCIUM: 8.9 mg/dL (ref 8.9–10.3)
CO2: 16 mmol/L — AB (ref 22–32)
CREATININE: 2.27 mg/dL — AB (ref 0.61–1.24)
Chloride: 109 mmol/L (ref 101–111)
GFR calc Af Amer: 38 mL/min — ABNORMAL LOW (ref >60)
GFR calc non Af Amer: 33 mL/min — ABNORMAL LOW (ref >60)
GLUCOSE: 108 mg/dL — AB (ref 65–99)
Potassium: 4 mmol/L (ref 3.5–5.1)
SODIUM: 136 mmol/L (ref 135–145)
Total Bilirubin: 1.9 mg/dL — ABNORMAL HIGH (ref 0.3–1.2)
Total Protein: 7.8 g/dL (ref 6.5–8.1)

## 2016-02-05 LAB — BASIC METABOLIC PANEL
Anion gap: 10 (ref 5–15)
BUN: 34 mg/dL — ABNORMAL HIGH (ref 6–20)
CO2: 18 mmol/L — ABNORMAL LOW (ref 22–32)
Calcium: 9.2 mg/dL (ref 8.9–10.3)
Chloride: 109 mmol/L (ref 101–111)
Creatinine, Ser: 1.39 mg/dL — ABNORMAL HIGH (ref 0.61–1.24)
GFR calc Af Amer: >60 mL/min (ref >60)
GFR calc non Af Amer: 60 mL/min — ABNORMAL LOW (ref >60)
Glucose, Bld: 90 mg/dL (ref 65–99)
Potassium: 3.9 mmol/L (ref 3.5–5.1)
Sodium: 137 mmol/L (ref 135–145)

## 2016-02-05 LAB — CBC
HCT: 40.2 % (ref 39.0–52.0)
Hemoglobin: 13.1 g/dL (ref 13.0–17.0)
MCH: 30.9 pg (ref 26.0–34.0)
MCHC: 32.6 g/dL (ref 30.0–36.0)
MCV: 94.8 fL (ref 78.0–100.0)
PLATELETS: 269 10*3/uL (ref 150–400)
RBC: 4.24 MIL/uL (ref 4.22–5.81)
RDW: 13 % (ref 11.5–15.5)
WBC: 8.6 10*3/uL (ref 4.0–10.5)

## 2016-02-05 LAB — RAPID URINE DRUG SCREEN, HOSP PERFORMED
Amphetamines: NOT DETECTED
BARBITURATES: NOT DETECTED
BENZODIAZEPINES: NOT DETECTED
COCAINE: NOT DETECTED
Opiates: NOT DETECTED
Tetrahydrocannabinol: POSITIVE — AB

## 2016-02-05 MED ORDER — ONDANSETRON HCL 4 MG PO TABS: 4.0000 mg | ORAL_TABLET | Freq: Four times a day (QID) | ORAL | Status: DC | PRN

## 2016-02-05 MED ORDER — HEPARIN SODIUM (PORCINE) 5000 UNIT/ML IJ SOLN
5000.0000 [IU] | Freq: Three times a day (TID) | INTRAMUSCULAR | Status: DC
  Administered 2016-02-06: 5000 [IU] via SUBCUTANEOUS
  Filled 2016-02-05 (×2): qty 1

## 2016-02-05 MED ORDER — ADULT MULTIVITAMIN W/MINERALS CH
1.0000 | ORAL_TABLET | Freq: Every day | ORAL | Status: DC
  Administered 2016-02-05 – 2016-02-06 (×2): 1 via ORAL
  Filled 2016-02-05 (×3): qty 1

## 2016-02-05 MED ORDER — THIAMINE HCL 100 MG/ML IJ SOLN
100.0000 mg | Freq: Every day | INTRAMUSCULAR | Status: DC
Start: 2016-02-05 — End: 2016-02-07

## 2016-02-05 MED ORDER — ONDANSETRON HCL 4 MG/2ML IJ SOLN: 4.0000 mg | Freq: Four times a day (QID) | INTRAMUSCULAR | Status: DC | PRN

## 2016-02-05 MED ORDER — LORAZEPAM 1 MG PO TABS: 1.0000 mg | ORAL_TABLET | Freq: Four times a day (QID) | ORAL | Status: DC | PRN

## 2016-02-05 MED ORDER — SODIUM CHLORIDE 0.9% FLUSH
3.0000 mL | Freq: Two times a day (BID) | INTRAVENOUS | Status: DC
  Administered 2016-02-05: 3 mL via INTRAVENOUS

## 2016-02-05 MED ORDER — OLANZAPINE 5 MG PO TBDP
5.0000 mg | ORAL_TABLET | Freq: Two times a day (BID) | ORAL | Status: DC
  Administered 2016-02-05 – 2016-02-06 (×2): 5 mg via ORAL
  Filled 2016-02-05 (×5): qty 1

## 2016-02-05 MED ORDER — HALOPERIDOL LACTATE 5 MG/ML IJ SOLN
2.0000 mg | Freq: Once | INTRAMUSCULAR | Status: AC
  Administered 2016-02-05: 2 mg via INTRAVENOUS
  Filled 2016-02-05: qty 1

## 2016-02-05 MED ORDER — LACTATED RINGERS IV SOLN
INTRAVENOUS | Status: DC
  Administered 2016-02-05 – 2016-02-07 (×7): via INTRAVENOUS

## 2016-02-05 MED ORDER — LORAZEPAM 2 MG/ML IJ SOLN
1.0000 mg | Freq: Four times a day (QID) | INTRAMUSCULAR | Status: DC | PRN
  Administered 2016-02-05 – 2016-02-06 (×3): 1 mg via INTRAVENOUS
  Filled 2016-02-05 (×4): qty 1

## 2016-02-05 MED ORDER — ACETAMINOPHEN 650 MG RE SUPP: 650.0000 mg | Freq: Four times a day (QID) | RECTAL | Status: DC | PRN

## 2016-02-05 MED ORDER — ZIPRASIDONE MESYLATE 20 MG IM SOLR
20.0000 mg | Freq: Once | INTRAMUSCULAR | Status: DC
  Filled 2016-02-05: qty 20

## 2016-02-05 MED ORDER — FOLIC ACID 1 MG PO TABS
1.0000 mg | ORAL_TABLET | Freq: Every day | ORAL | Status: DC
  Administered 2016-02-05 – 2016-02-06 (×2): 1 mg via ORAL
  Filled 2016-02-05 (×2): qty 1

## 2016-02-05 MED ORDER — ENOXAPARIN SODIUM 40 MG/0.4ML ~~LOC~~ SOLN: 40.0000 mg | SUBCUTANEOUS | Status: DC

## 2016-02-05 MED ORDER — HEPARIN SODIUM (PORCINE) 5000 UNIT/ML IJ SOLN
5000.0000 [IU] | Freq: Two times a day (BID) | INTRAMUSCULAR | Status: DC
Start: 2016-02-06 — End: 2016-02-05

## 2016-02-05 MED ORDER — ACETAMINOPHEN 325 MG PO TABS: 650.0000 mg | ORAL_TABLET | Freq: Four times a day (QID) | ORAL | Status: DC | PRN

## 2016-02-05 MED ORDER — HYDROXYZINE HCL 25 MG PO TABS
25.0000 mg | ORAL_TABLET | Freq: Two times a day (BID) | ORAL | Status: DC
Start: 2016-02-05 — End: 2016-02-07
  Administered 2016-02-05 – 2016-02-06 (×2): 25 mg via ORAL
  Filled 2016-02-05 (×2): qty 1

## 2016-02-05 MED ORDER — ENOXAPARIN SODIUM 30 MG/0.3ML ~~LOC~~ SOLN
30.0000 mg | SUBCUTANEOUS | Status: DC
  Administered 2016-02-05: 30 mg via SUBCUTANEOUS
  Filled 2016-02-05: qty 0.3

## 2016-02-05 MED ORDER — VITAMIN B-1 100 MG PO TABS
100.0000 mg | ORAL_TABLET | Freq: Every day | ORAL | Status: DC
  Administered 2016-02-05 – 2016-02-06 (×2): 100 mg via ORAL
  Filled 2016-02-05 (×2): qty 1

## 2016-02-05 NOTE — Progress Notes (Signed)
PROGRESS NOTE    Omar Nelson  QBV:[id number] DOB: 1971/03/10 DOA: 02/04/2016 PCP: No primary care provider on file.  Outpatient Specialists:   Brief Narrative: 45 y.o. male with medical history significant of asthma. He was released from a [place] prison in Aug 2016 and lives with or near his sister who checks on him regularly.  She last saw him on his [birth year], [birth year] and he was fine at that time.  His sister denies h/o mental illness and says that he has been doing very well since his release from prison.  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 She is visiting family in [place] currently.  Tonight, the patient was found running between cars in the middle of the street.  The patient apparently ran up to a police officer screaming "help, help!".  He arrived in the ER and refused to answer questions, saying that he was being recorded.  He was apparently thinking that the people in cars were trying to kill him.    Assessment & Plan:   Principal Problem:   Cannabis-induced psychotic disorder with delusions (HCC) Active Problems:   Acute renal failure (ARF) (HCC)   Psychosis   Cannabis use disorder, severe, dependence (HCC)  - AKI likely secondary Rhabdomyolysis - Rhabdomyolysis, possibly drug induced (Urine toxicology was positive for cannabinoids) - Acute psychosis, likely drug induced  -Aggressive hydration -Monitor Renal function and electrolytes -Psych consult - Called -Likely transfer when AKI resolves, and if patient is still psychotic -Continue to assess patient. -AKI work up.  DVT prophylaxis: [place] Heparin 5000 units bid Code Status: Full Family Communication: None available Disposition Plan: Undetermined   Consultants:   Psychiatry  Subjective: Nil new complaints. Remains psychotic. Difficult to get history from patient  Objective: Vitals:   02/04/16 2230 02/04/16 2300 02/05/16 0500 02/05/16 1318  BP: 110/66 131/73 (!) 110/52 (!) 141/86  Pulse: 81 72 77  85  Resp: 21 21 16 18   Temp:   98.1 F (36.7 C)   TempSrc:   Oral   SpO2: 97% 100% 98% 97%  Weight:      Height:        Intake/Output Summary (Last 24 hours) at 02/05/16 1410 Last data filed at 02/05/16 1246  Gross per 24 hour  Intake          1243.33 ml  Output             1780 ml  Net          -536.67 ml   Filed Weights   02/04/16 1922  Weight: 86.2 kg (190 lb)    Examination:  General exam: Remains floridly psychotic.  Respiratory system: Clear to auscultation. Respiratory effort normal. Cardiovascular system: S1 & S2 heard, RRR.   Gastrointestinal system: Abdomen is nondistended, soft and nontender.   Central nervous system: Alert and oriented. Paranoid psychosis, with some flight of ideas. Extremities: Symmetric 5 x 5 power.   Data Reviewed: I have personally reviewed following labs and imaging studies  CBC:  Recent Labs Lab 02/04/16 1946 02/05/16 0534  WBC 11.4* 8.6  NEUTROABS 9.4*  --   HGB 15.0 13.1  HCT 44.4 40.2  MCV 93.3 94.8  PLT 313 269   Basic Metabolic Panel:  Recent Labs Lab 02/04/16 1946 02/05/16 0534  NA 139 136  K 4.7 4.0  CL 99* 109  CO2 14* 16*  GLUCOSE 103* 108*  BUN 64* 48*  CREATININE 5.57* 2.27*  CALCIUM 9.9 8.9   GFR:  Estimated Creatinine Clearance: 43.8 mL/min (by C-G formula based on SCr of 2.27 mg/dL). Liver Function Tests:  Recent Labs Lab 02/04/16 1946 02/05/16 0534  AST 136* 155*  ALT 39 38  ALKPHOS 67 50  BILITOT 2.1* 1.9*  PROT 10.0* 7.8  ALBUMIN 5.8* 4.4   No results for input(s): LIPASE, AMYLASE in the last 168 hours. No results for input(s): AMMONIA in the last 168 hours. Coagulation Profile: No results for input(s): INR, PROTIME in the last 168 hours. Cardiac Enzymes:  Recent Labs Lab 02/04/16 1946 02/05/16 0534  CKTOTAL 5,185* 8,996*   BNP (last 3 results) No results for input(s): PROBNP in the last 8760 hours. HbA1C: No results for input(s): HGBA1C in the last 72 hours. CBG: No results  for input(s): GLUCAP in the last 168 hours. Lipid Profile: No results for input(s): CHOL, HDL, LDLCALC, TRIG, CHOLHDL, LDLDIRECT in the last 72 hours. Thyroid Function Tests: No results for input(s): TSH, T4TOTAL, FREET4, T3FREE, THYROIDAB in the last 72 hours. Anemia Panel: No results for input(s): VITAMINB12, FOLATE, FERRITIN, TIBC, IRON, RETICCTPCT in the last 72 hours. Urine analysis:    Component Value Date/Time   COLORURINE YELLOW 02/04/2016 2149   APPEARANCEUR CLOUDY (A) 02/04/2016 2149   LABSPEC 1.018 02/04/2016 2149   PHURINE 5.0 02/04/2016 2149   GLUCOSEU NEGATIVE 02/04/2016 2149   HGBUR LARGE (A) 02/04/2016 2149   BILIRUBINUR SMALL (A) 02/04/2016 2149   KETONESUR NEGATIVE 02/04/2016 2149   PROTEINUR 30 (A) 02/04/2016 2149   NITRITE NEGATIVE 02/04/2016 2149   LEUKOCYTESUR NEGATIVE 02/04/2016 2149   Sepsis Labs: (procalcitonin:4,lacticidven:4)  )No results found for this or any previous visit (from the past 240 hour(s)).       Radiology Studies: Ct Head Wo Contrast  Result Date: 02/04/2016 CLINICAL DATA:  Altered mental status.  Patient combative. EXAM: CT HEAD WITHOUT CONTRAST TECHNIQUE: Contiguous axial images were obtained from the base of the skull through the vertex without intravenous contrast. COMPARISON:  None. FINDINGS: Ventricles, cisterns and other CSF spaces are within normal. There is no mass, mass effect, shift of midline structures or acute hemorrhage. Remaining bones and soft tissues are within normal. IMPRESSION: No acute intracranial findings. Electronically Signed   By: Elberta Fortis M.D.   On: 02/04/2016 20:53   Dg Chest Portable 1 View  Result Date: 02/04/2016 CLINICAL DATA:  Patient with altered mental status. EXAM: PORTABLE CHEST 1 VIEW COMPARISON:  None. FINDINGS: Monitoring leads overlie the patient. Normal cardiac and mediastinal contours. No consolidative pulmonary opacities. No pleural effusion or pneumothorax. IMPRESSION: No acute  cardiopulmonary process. Electronically Signed   By: Annia Belt M.D.   On: 02/04/2016 19:45        Scheduled Meds: . [START ON 02/06/2016] enoxaparin (LOVENOX) injection  40 mg Subcutaneous Q24H  . folic acid  1 mg Oral Daily  . hydrOXYzine  25 mg Oral BID PC  . multivitamin with minerals  1 tablet Oral Daily  . OLANZapine zydis  5 mg Oral BID PC  . sodium chloride flush  3 mL Intravenous Q12H  . thiamine  100 mg Oral Daily   Or  . thiamine  100 mg Intravenous Daily   Continuous Infusions: . lactated ringers 200 mL/hr at 02/05/16 1052     LOS: 1 day    Time spent: 40 Minutes.    Berton Mount, MD  [facility] Pager #: [phone] 7PM-7AM contact night coverage as above

## 2016-02-05 NOTE — Progress Notes (Signed)
Pt with only 150 ml of urine output since AM. Dr. Dartha Lodge notified. Order given for a bmp. Lab technician made aware. Pt with external/condom catheter for output monitoring per MD. Ofilia Neas.
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
## 2016-02-06 DIAGNOSIS — M6282 Rhabdomyolysis: Secondary | ICD-10-CM

## 2016-02-06 LAB — HEPATIC FUNCTION PANEL
ALT: 74 U/L — ABNORMAL HIGH (ref 17–63)
AST: 272 U/L — ABNORMAL HIGH (ref 15–41)
Albumin: 4.1 g/dL (ref 3.5–5.0)
Alkaline Phosphatase: 45 U/L (ref 38–126)
Bilirubin, Direct: 0.2 mg/dL (ref 0.1–0.5)
Indirect Bilirubin: 1.4 mg/dL — ABNORMAL HIGH (ref 0.3–0.9)
Total Bilirubin: 1.6 mg/dL — ABNORMAL HIGH (ref 0.3–1.2)
Total Protein: 7.1 g/dL (ref 6.5–8.1)

## 2016-02-06 LAB — CK: Total CK: 8749 U/L — ABNORMAL HIGH (ref 49–397)

## 2016-02-06 LAB — HEPATITIS PANEL, ACUTE
HCV Ab: <0.1 s/co ratio (ref 0.0–0.9)
Hep A IgM: NEGATIVE
Hep B C IgM: NEGATIVE
Hepatitis B Surface Ag: NEGATIVE

## 2016-02-06 LAB — BASIC METABOLIC PANEL
Anion gap: 9 (ref 5–15)
BUN: 20 mg/dL (ref 6–20)
CO2: 23 mmol/L (ref 22–32)
Calcium: 9.1 mg/dL (ref 8.9–10.3)
Chloride: 105 mmol/L (ref 101–111)
Creatinine, Ser: 0.95 mg/dL (ref 0.61–1.24)
GFR calc Af Amer: >60 mL/min (ref >60)
GFR calc non Af Amer: >60 mL/min (ref >60)
Glucose, Bld: 95 mg/dL (ref 65–99)
Potassium: 3.9 mmol/L (ref 3.5–5.1)
Sodium: 137 mmol/L (ref 135–145)

## 2016-02-06 LAB — C4 COMPLEMENT: Complement C4, Body Fluid: 47 mg/dL — ABNORMAL HIGH (ref 14–44)

## 2016-02-06 LAB — C3 COMPLEMENT: C3 Complement: 120 mg/dL (ref 82–167)

## 2016-02-06 MED ORDER — NICOTINE 21 MG/24HR TD PT24
21.0000 mg | MEDICATED_PATCH | Freq: Every day | TRANSDERMAL | Status: DC
  Filled 2016-02-06: qty 1

## 2016-02-06 NOTE — Progress Notes (Signed)
PROGRESS NOTE    Omar Nelson  BHA:[id number] DOB: [birth year] DOA: 02/04/2016 PCP: No primary care provider on file.  Outpatient Specialists:   Brief Narrative: 45 y.o. male with medical history significant of asthma. He was released from a [place] prison in Aug 2016 and lives with or near his sister who checks on him regularly.  She last saw him on his birthday, [birth year] and he was fine at that time.  His sister denies h/o mental illness and says that he has been doing very well since his release from prison.  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 She is visiting family in [place] currently.  Tonight, the patient was found running between cars in the middle of the street.  The patient apparently ran up to a police officer screaming "help, help!".  He arrived in the ER and refused to answer questions, saying that he was being recorded.  He was apparently thinking that the people in cars were trying to kill him.    Assessment & Plan:   Principal Problem:   Cannabis-induced psychotic disorder with delusions (HCC) Active Problems:   Acute renal failure (ARF) (HCC)   Psychosis   Cannabis use disorder, severe, dependence (HCC)  - AKI likely secondary Rhabdomyolysis, resolved. - Rhabdomyolysis, possibly drug induced (Urine toxicology was positive for cannabinoids), resolved significantly - Acute psychosis, likely drug induced  -Decrease IVF to 100cc/hr -Monitor Renal function and electrolytes (am labs) -Psych contacted to follow up patient. -Likely transfer to Psych in am.   DVT prophylaxis: [place] Heparin 5000 units bid Code Status: Full Family Communication: None available Disposition Plan: Undetermined   Consultants:   Psychiatry  Subjective: Remains paranoid. Lacks insight. No fever or chills.  No SOB.   Objective: Vitals:   02/05/16 1814 02/05/16 2125 02/06/16 0523 02/06/16 1200  BP: 132/74 (!) 142/109 134/83 126/77  Pulse: 82 88 62 (!) 58  Resp:  (!) 22 18 19     Temp:  97.9 F (36.6 C) 98.4 F (36.9 C) 97.5 F (36.4 C)  TempSrc:  Oral Oral Oral  SpO2:  97% 100%   Weight:      Height:        Intake/Output Summary (Last 24 hours) at 02/06/16 1324 Last data filed at 02/06/16 0900  Gross per 24 hour  Intake             5490 ml  Output             2500 ml  Net             2990 ml   Filed Weights   02/04/16 1922  Weight: 86.2 kg (190 lb)    Examination:  General exam: Remains psychotic.  Respiratory system: Clear to auscultation. Respiratory effort normal. Cardiovascular system: S1 & S2 heard, RRR.   Gastrointestinal system: Abdomen is nondistended, soft and nontender.   Central nervous system: Alert and oriented. Paranoid psychosis. Extremities: No leg edema.   Data Reviewed: I have personally reviewed following labs and imaging studies  CBC:  Recent Labs Lab 02/04/16 1946 02/05/16 0534  WBC 11.4* 8.6  NEUTROABS 9.4*  --   HGB 15.0 13.1  HCT 44.4 40.2  MCV 93.3 94.8  PLT 313 269   Basic Metabolic Panel:  Recent Labs Lab 02/04/16 1946 02/05/16 0534 02/05/16 1430 02/06/16 1015  NA 139 136 137 137  K 4.7 4.0 3.9 3.9  CL 99* 109 109 105  CO2 14* 16* 18* 23  GLUCOSE  103* 108* 90 95  BUN 64* 48* 34* 20  CREATININE 5.57* 2.27* 1.39* 0.95  CALCIUM 9.9 8.9 9.2 9.1   GFR: Estimated Creatinine Clearance: 104.6 mL/min (by C-G formula based on SCr of 0.95 mg/dL). Liver Function Tests:  Recent Labs Lab 02/04/16 1946 02/05/16 0534 02/06/16 1015  AST 136* 155* 272*  ALT 39 38 74*  ALKPHOS 67 50 45  BILITOT 2.1* 1.9* 1.6*  PROT 10.0* 7.8 7.1  ALBUMIN 5.8* 4.4 4.1   No results for input(s): LIPASE, AMYLASE in the last 168 hours. No results for input(s): AMMONIA in the last 168 hours. Coagulation Profile: No results for input(s): INR, PROTIME in the last 168 hours. Cardiac Enzymes:  Recent Labs Lab 02/04/16 1946 02/05/16 0534 02/05/16 1430 02/05/16 2223  CKTOTAL 5,185* 8,996* 10,374* 10,725*   BNP (last  3 results) No results for input(s): PROBNP in the last 8760 hours. HbA1C: No results for input(s): HGBA1C in the last 72 hours. CBG: No results for input(s): GLUCAP in the last 168 hours. Lipid Profile: No results for input(s): CHOL, HDL, LDLCALC, TRIG, CHOLHDL, LDLDIRECT in the last 72 hours. Thyroid Function Tests: No results for input(s): TSH, T4TOTAL, FREET4, T3FREE, THYROIDAB in the last 72 hours. Anemia Panel: No results for input(s): VITAMINB12, FOLATE, FERRITIN, TIBC, IRON, RETICCTPCT in the last 72 hours. Urine analysis:    Component Value Date/Time   COLORURINE YELLOW 02/04/2016 2149   APPEARANCEUR CLOUDY (A) 02/04/2016 2149   LABSPEC 1.018 02/04/2016 2149   PHURINE 5.0 02/04/2016 2149   GLUCOSEU NEGATIVE 02/04/2016 2149   HGBUR LARGE (A) 02/04/2016 2149   BILIRUBINUR SMALL (A) 02/04/2016 2149   KETONESUR NEGATIVE 02/04/2016 2149   PROTEINUR 30 (A) 02/04/2016 2149   NITRITE NEGATIVE 02/04/2016 2149   LEUKOCYTESUR NEGATIVE 02/04/2016 2149   Sepsis Labs: (procalcitonin:4,lacticidven:4)  )No results found for this or any previous visit (from the past 240 hour(s)).       Radiology Studies: Ct Head Wo Contrast  Result Date: 02/04/2016 CLINICAL DATA:  Altered mental status.  Patient combative. EXAM: CT HEAD WITHOUT CONTRAST TECHNIQUE: Contiguous axial images were obtained from the base of the skull through the vertex without intravenous contrast. COMPARISON:  None. FINDINGS: Ventricles, cisterns and other CSF spaces are within normal. There is no mass, mass effect, shift of midline structures or acute hemorrhage. Remaining bones and soft tissues are within normal. IMPRESSION: No acute intracranial findings. Electronically Signed   By: Elberta Fortis M.D.   On: 02/04/2016 20:53   US Renal  Result Date: 02/05/2016 CLINICAL DATA:  Renal failure EXAM: RENAL / URINARY TRACT ULTRASOUND COMPLETE COMPARISON:  None. FINDINGS: Right Kidney: Length: 11.6 cm. Echogenicity  within normal limits. No mass or hydronephrosis visualized. Left Kidney: Length: 11 cm. Echogenicity within normal limits. No mass or hydronephrosis visualized. Bladder: Appears normal for degree of bladder distention. IMPRESSION: Normal renal ultrasound. Electronically Signed   By: Bary Richard M.D.   On: 02/05/2016 15:44  Dg Chest Portable 1 View  Result Date: 02/04/2016 CLINICAL DATA:  Patient with altered mental status. EXAM: PORTABLE CHEST 1 VIEW COMPARISON:  None. FINDINGS: Monitoring leads overlie the patient. Normal cardiac and mediastinal contours. No consolidative pulmonary opacities. No pleural effusion or pneumothorax. IMPRESSION: No acute cardiopulmonary process. Electronically Signed   By: Annia Belt M.D.   On: 02/04/2016 19:45        Scheduled Meds: . folic acid  1 mg Oral Daily  . heparin subcutaneous  5,000 Units Subcutaneous Q8H  . hydrOXYzine  25 mg Oral BID PC  . multivitamin with minerals  1 tablet Oral Daily  . OLANZapine zydis  5 mg Oral BID PC  . sodium chloride flush  3 mL Intravenous Q12H  . thiamine  100 mg Oral Daily   Or  . thiamine  100 mg Intravenous Daily   Continuous Infusions: . lactated ringers 200 mL/hr at 02/06/16 0621     LOS: 2 days    Time spent: 40 Minutes.    Berton Mount, MD  [facility] Pager #: [phone] 7PM-7AM contact night coverage as above

## 2016-02-06 NOTE — Progress Notes (Signed)
Pt requesting ID and belongings from security office to be given to friend, Loraine Maple. Entire packet from security office given to [place]. Witnessed by Comptroller, Brigitte Pulse

## 2016-02-06 NOTE — Progress Notes (Signed)
Labs at 2223pm noted CK 10.725. Provider on call notified via Amion Paging. Clydie Braun, RN

## 2016-02-07 LAB — RENAL FUNCTION PANEL
Albumin: 4 g/dL (ref 3.5–5.0)
Anion gap: 5 (ref 5–15)
BUN: 15 mg/dL (ref 6–20)
CO2: 28 mmol/L (ref 22–32)
Calcium: 9.2 mg/dL (ref 8.9–10.3)
Chloride: 107 mmol/L (ref 101–111)
Creatinine, Ser: 0.95 mg/dL (ref 0.61–1.24)
GFR calc Af Amer: >60 mL/min (ref >60)
GFR calc non Af Amer: >60 mL/min (ref >60)
Glucose, Bld: 93 mg/dL (ref 65–99)
Phosphorus: 2.7 mg/dL (ref 2.5–4.6)
Potassium: 4.1 mmol/L (ref 3.5–5.1)
Sodium: 140 mmol/L (ref 135–145)

## 2016-02-07 LAB — COMPREHENSIVE METABOLIC PANEL
ALT: 73 U/L — ABNORMAL HIGH (ref 17–63)
AST: 211 U/L — ABNORMAL HIGH (ref 15–41)
Albumin: 4 g/dL (ref 3.5–5.0)
Alkaline Phosphatase: 42 U/L (ref 38–126)
Anion gap: 5 (ref 5–15)
BUN: 16 mg/dL (ref 6–20)
CO2: 28 mmol/L (ref 22–32)
Calcium: 9.1 mg/dL (ref 8.9–10.3)
Chloride: 107 mmol/L (ref 101–111)
Creatinine, Ser: 0.95 mg/dL (ref 0.61–1.24)
GFR calc Af Amer: >60 mL/min (ref >60)
GFR calc non Af Amer: >60 mL/min (ref >60)
Glucose, Bld: 90 mg/dL (ref 65–99)
Potassium: 4.2 mmol/L (ref 3.5–5.1)
Sodium: 140 mmol/L (ref 135–145)
Total Bilirubin: 1.2 mg/dL (ref 0.3–1.2)
Total Protein: 7.1 g/dL (ref 6.5–8.1)

## 2016-02-07 LAB — CK: Total CK: 4114 U/L — ABNORMAL HIGH (ref 49–397)

## 2016-02-07 MED ORDER — OLANZAPINE 5 MG PO TBDP: 5.0000 mg | ORAL_TABLET | Freq: Two times a day (BID) | ORAL | 0 refills | Status: AC

## 2016-02-07 NOTE — Progress Notes (Signed)
Pt offered morning meds as ordered and stated "no meds, I'm not taking any medicines" Will inform MD on rounds

## 2016-02-07 NOTE — Progress Notes (Signed)
Pt discharged to home with sister.  Pt is A/O x4 and ambulates without assistance.  Pt and sister verbalized understanding of discharge instructions and follow up care.  All belongings sent home with pt.  Education completed re: medications, when to call MD and follow up care.  Sundra Aland, RN

## 2016-02-07 NOTE — Progress Notes (Signed)
Pt belonging bag of shoes and clothes was placed in pt belonging bins in locked equipment room on [address].

## 2016-02-07 NOTE — Discharge Summary (Signed)
Physician Discharge Summary  Omar Nelson ZOX:[id number] DOB: [birth year] DOA: 02/04/2016  PCP: No primary care provider on file.  Admit date: 02/04/2016 Discharge date: 02/07/2016  Time spent: Greater than 30 minutes  Recommendations for Outpatient Follow-up:  1. Discharge patient to Behavioral health (Psychiatry facility) 2. Follow up with PCP and psychiatry.   Discharge Diagnoses:  Principal Problem:   Cannabis-induced psychotic disorder with delusions (HCC) Acute Kidney Injury  Rhabdomyolysis  Active Problems:   Acute renal failure (ARF) (HCC)   Psychosis   Cannabis use disorder, severe, dependence (HCC)   Discharge Condition: Stable  Diet recommendation: Regular Diet  Filed Weights   02/04/16 1922  Weight: 86.2 kg (190 lb)    History of present illness: 45 year old male with medical history significant for asthma. Patient was released from a [place] prison in August 2016 and lives with or near his sister who checks on him regularly. Patient was last saw on his birthday (January 30, 2016) and patient was reported to be fine at that time. No prior mental illness was endorsed. Patient was said to be doing very well since his release from prison. On the day of admission, patient was found running between cars in the middle of the street. The patient apparently ran up to a police officer screaming "help, help!". He arrived in the ER and refused to answer questions, saying that he was being recorded. Patient thought that the people in cars were trying to kill him. Urine toxicology was positive for Tetrahydrocannabinol. BUN on admission was 64 and Serum creatinine was 5.57. CPK was greater than 5000 and peaked at 10,725 during the hospital stay.  Hospital Course: Patient was admitted to the Medical team and patient was in acute renal failure, likely secondary to rhabdomyolysis. Renal failure was managed with aggressive hydration. BUN is down to 16, and serum creatinine is down to 0.95.  CPK is on the downward trend, and down to 4,114. Patient is still on IVF. Patient will be transferred to a Psychiatry facility once a bed becomes available. Renal ultrasound was normal.  Patient's psychosis was managed by the Psychiatry team during the hospital stay. Patient was also involuntarily committed by the Psychiatry team.  Procedures:  None.  Consultations:  Psychiatry  Discharge Exam: Vitals:   02/06/16 2122 02/07/16 0500  BP: 113/78 124/84  Pulse: (!) 56 (!) 57  Resp: 20 18  Temp: 98.6 F (37 C) 98.1 F (36.7 C)    General: Awake and alert. Cardiovascular: S1S2 Respiratory: Clear to auscultation  Discharge Instructions   Discharge Instructions    Diet - low sodium heart healthy    Complete by:  As directed   Increase activity slowly    Complete by:  As directed     Current Discharge Medication List    START taking these medications   Details  OLANZapine zydis (ZYPREXA) 5 MG disintegrating tablet Take 1 tablet (5 mg total) by mouth 2 (two) times daily after a meal. Qty: 30 tablet, Refills: 0      CONTINUE these medications which have NOT CHANGED   Details  albuterol (PROVENTIL HFA;VENTOLIN HFA) 108 (90 Base) MCG/ACT inhaler Inhale 1-2 puffs into the lungs every 6 (six) hours as needed for wheezing or shortness of breath. Qty: 1 Inhaler, Refills: 0    fluticasone furoate-vilanterol (BREO ELLIPTA) 100-25 MCG/INH AEPB Inhale 1 puff into the lungs daily. Qty: 1 each, Refills: 0      STOP taking these medications     loratadine (CLARITIN)  10 MG tablet        No Known Allergies    The results of significant diagnostics from this hospitalization (including imaging, microbiology, ancillary and laboratory) are listed below for reference.    Significant Diagnostic Studies: Ct Head Wo Contrast  Result Date: 02/04/2016 CLINICAL DATA:  Altered mental status.  Patient combative. EXAM: CT HEAD WITHOUT CONTRAST TECHNIQUE: Contiguous axial images were  obtained from the base of the skull through the vertex without intravenous contrast. COMPARISON:  None. FINDINGS: Ventricles, cisterns and other CSF spaces are within normal. There is no mass, mass effect, shift of midline structures or acute hemorrhage. Remaining bones and soft tissues are within normal. IMPRESSION: No acute intracranial findings. Electronically Signed   By: Elberta Fortis M.D.   On: 02/04/2016 20:53   US Renal  Result Date: 02/05/2016 CLINICAL DATA:  Renal failure EXAM: RENAL / URINARY TRACT ULTRASOUND COMPLETE COMPARISON:  None. FINDINGS: Right Kidney: Length: 11.6 cm. Echogenicity within normal limits. No mass or hydronephrosis visualized. Left Kidney: Length: 11 cm. Echogenicity within normal limits. No mass or hydronephrosis visualized. Bladder: Appears normal for degree of bladder distention. IMPRESSION: Normal renal ultrasound. Electronically Signed   By: Bary Richard M.D.   On: 02/05/2016 15:44  Dg Chest Portable 1 View  Result Date: 02/04/2016 CLINICAL DATA:  Patient with altered mental status. EXAM: PORTABLE CHEST 1 VIEW COMPARISON:  None. FINDINGS: Monitoring leads overlie the patient. Normal cardiac and mediastinal contours. No consolidative pulmonary opacities. No pleural effusion or pneumothorax. IMPRESSION: No acute cardiopulmonary process. Electronically Signed   By: Annia Belt M.D.   On: 02/04/2016 19:45    Microbiology: No results found for this or any previous visit (from the past 240 hour(s)).   Labs: Basic Metabolic Panel:  Recent Labs Lab 02/04/16 1946 02/05/16 0534 02/05/16 1430 02/06/16 1015 02/07/16 0530  NA 139 136 137 137 140  140  K 4.7 4.0 3.9 3.9 4.2  4.1  CL 99* 109 109 105 107  107  CO2 14* 16* 18* 23 28  28   GLUCOSE 103* 108* 90 95 90  93  BUN 64* 48* 34* 20 16  15   CREATININE 5.57* 2.27* 1.39* 0.95 0.95  0.95  CALCIUM 9.9 8.9 9.2 9.1 9.1  9.2  PHOS  --   --   --   --  2.7   Liver Function Tests:  Recent Labs Lab  02/04/16 1946 02/05/16 0534 02/06/16 1015 02/07/16 0530  AST 136* 155* 272* 211*  ALT 39 38 74* 73*  ALKPHOS 67 50 45 42  BILITOT 2.1* 1.9* 1.6* 1.2  PROT 10.0* 7.8 7.1 7.1  ALBUMIN 5.8* 4.4 4.1 4.0  4.0   No results for input(s): LIPASE, AMYLASE in the last 168 hours. No results for input(s): AMMONIA in the last 168 hours. CBC:  Recent Labs Lab 02/04/16 1946 02/05/16 0534  WBC 11.4* 8.6  NEUTROABS 9.4*  --   HGB 15.0 13.1  HCT 44.4 40.2  MCV 93.3 94.8  PLT 313 269   Cardiac Enzymes:  Recent Labs Lab 02/05/16 0534 02/05/16 1430 02/05/16 2223 02/06/16 1015 02/07/16 0530  CKTOTAL 8,996* 10,374* 10,725* 8,749* 4,114*   BNP: BNP (last 3 results) No results for input(s): BNP in the last 8760 hours.  ProBNP (last 3 results) No results for input(s): PROBNP in the last 8760 hours.  CBG: No results for input(s): GLUCAP in the last 168 hours.     Signed:  Berton Mount, MD  [facility] Pager #:  [phone] 7PM-7AM contact night coverage as above

## 2016-02-07 NOTE — Consult Note (Signed)
[facility] Consult   Reason for Consult: disorganized thoughts, psychosis, paranoia Referring Physician: Dr. [name] Patient Identification: Omar Nelson MRN:  [id number] Principal Diagnosis: Cannabis-induced psychotic disorder with delusions [facility]) Diagnosis:   Patient Active Problem List   Diagnosis Date Noted  . Psychosis [F29] 02/05/2016  . Cannabis use disorder, severe, dependence ([place]) [F12.20] 02/05/2016  . Cannabis-induced psychotic disorder with delusions ([place]) [F12.950] 02/05/2016  . Acute renal failure (ARF) ([place]) [N17.9] 02/04/2016    Total Time spent with patient: 55 Minutes  Subjective:   Omar Nelson is a 45 y.o. male patient admitted with psychosis.  HPI: [name] is a 45 years old single male admitted with cannabis induced psychotic disorder and acute renal failure and combative at emergency department require Geodon injections. He was initially evaluated by Dr. Darleene Cleaver and recommend in patient psych admission. Requested re-evaluation as he seems to be improved with his mental status and has supportive family member at this bed side. Information for this evaluation obtained from medical records, case discussed with psych social service, Hospitalist, and patient sister who is at bedside. Patient stated that he was relocated to [place] due to his sister being here about ten months ago and for good change from his past street life. 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 Patient received appropriate medical care which responded positively and his CK levels are trending down and now considered medically cleared. Patient stated that he has been thinking cleared since this morning and never has history of mental illness. Patient has been free from psychotic agitation, denied auditory and visual hallucinations and no evidence of suicide or homicide ideation, intention or plans. He  endorses occasional cannabis abuse and alcohol abuse.  Patient does not meet criteria for acute psychiatric admission as he is psychiatrically cleared as of now and does not meet IVC so we will rescind it and will discharge to his sister's care and out patient psychiatric and substance abuse counseling services as needed. Patient seems to be intimidating with his loud voice and mannerisms as per his sister and he says that he is [employer].     Past Psychiatric History: He has no known history of mental illness and no psychiatric admissions.   Risk to Self: Is patient at risk for suicide?: No Risk to Others:   Prior Inpatient Therapy:   Prior Outpatient Therapy:    Past Medical History:  Past Medical History:  Diagnosis Date  . Asthma    History reviewed. No pertinent surgical history. Family History: History reviewed. No pertinent family history. Family Psychiatric  History: Patient and his sister denied family history of mental illness.  Social History:  History  Alcohol Use No     History  Drug Use No    Social History   Social History  . Marital status: Legally Separated    Spouse name: N/A  . Number of children: N/A  . Years of education: N/A   Social History Main Topics  . Smoking status: Current Every Day Smoker  . Smokeless tobacco: None  . Alcohol use No  . Drug use: No  . Sexual activity: Not Asked   Other Topics Concern  . None   Social History Narrative  . None   Additional Social History:    Allergies:  No Known Allergies  Labs:  Results for orders placed or performed during the hospital encounter of 02/04/16 (from the past 48 hour(s))  C4 complement     Status: Abnormal   Collection Time: 02/05/16  2:30 PM  Result Value Ref Range   Complement C4, Body Fluid 47 (H) 14 - 44 mg/dL    Comment: (NOTE) Performed At: [facility] [place], [place] [id number] Lindon Romp MD VV:[id number]   C3 complement     Status: None    Collection Time: 02/05/16  2:30 PM  Result Value Ref Range   C3 Complement 120 82 - 167 mg/dL    Comment: (NOTE) Performed At: [facility] [address] [place], [place] [id number] Lindon Romp MD VO:[id number]   Hepatitis panel, acute     Status: None   Collection Time: 02/05/16  2:30 PM  Result Value Ref Range   Hepatitis B Surface Ag Negative Negative   HCV Ab <0.1 0.0 - 0.9 s/co ratio    Comment: (NOTE)                                  Negative:     < 0.8                             Indeterminate: 0.8 - 0.9                                  Positive:     > 0.9 The CDC recommends that a positive HCV antibody result be followed up with a HCV Nucleic Acid Amplification test ([id number]). Performed At: [facility] [place], [place] [id number] Lindon Romp MD FB:[id number]    Hep A IgM Negative Negative   Hep B C IgM Negative Negative  CK     Status: Abnormal   Collection Time: 02/05/16  2:30 PM  Result Value Ref Range   Total CK 10,374 (H) 49 - 397 U/L    Comment: RESULTS CONFIRMED BY MANUAL DILUTION  Basic metabolic panel     Status: Abnormal   Collection Time: 02/05/16  2:30 PM  Result Value Ref Range   Sodium 137 135 - 145 mmol/L   Potassium 3.9 3.5 - 5.1 mmol/L   Chloride 109 101 - 111 mmol/L   CO2 18 (L) 22 - 32 mmol/L   Glucose, Bld 90 65 - 99 mg/dL   BUN 34 (H) 6 - 20 mg/dL   Creatinine, Ser 1.39 (H) 0.61 - 1.24 mg/dL   Calcium 9.2 8.9 - 10.3 mg/dL   GFR calc non Af Amer 60 (L) >60 mL/min   GFR calc Af Amer >60 >60 mL/min    Comment: (NOTE) The eGFR has been calculated using the CKD EPI equation. This calculation has not been validated in all clinical situations. eGFR's persistently <60 mL/min signify possible Chronic Kidney Disease.    Anion gap 10 5 - 15  CK     Status: Abnormal   Collection Time: 02/05/16 10:23 PM  Result Value Ref Range   Total CK 10,725 (H) 49 - 397 U/L    Comment: RESULTS CONFIRMED BY MANUAL  DILUTION  Basic metabolic panel     Status: None   Collection Time: 02/06/16 10:15 AM  Result Value Ref Range   Sodium 137 135 - 145 mmol/L   Potassium 3.9 3.5 - 5.1 mmol/L   Chloride 105 101 - 111 mmol/L  CO2 23 22 - 32 mmol/L   Glucose, Bld 95 65 - 99 mg/dL   BUN 20 6 - 20 mg/dL   Creatinine, Ser 0.95 0.61 - 1.24 mg/dL   Calcium 9.1 8.9 - 10.3 mg/dL   GFR calc non Af Amer >60 >60 mL/min   GFR calc Af Amer >60 >60 mL/min    Comment: (NOTE) The eGFR has been calculated using the CKD EPI equation. This calculation has not been validated in all clinical situations. eGFR's persistently <60 mL/min signify possible Chronic Kidney Disease.    Anion gap 9 5 - 15  Hepatic function panel     Status: Abnormal   Collection Time: 02/06/16 10:15 AM  Result Value Ref Range   Total Protein 7.1 6.5 - 8.1 g/dL   Albumin 4.1 3.5 - 5.0 g/dL   AST 272 (H) 15 - 41 U/L   ALT 74 (H) 17 - 63 U/L   Alkaline Phosphatase 45 38 - 126 U/L   Total Bilirubin 1.6 (H) 0.3 - 1.2 mg/dL   Bilirubin, Direct 0.2 0.1 - 0.5 mg/dL   Indirect Bilirubin 1.4 (H) 0.3 - 0.9 mg/dL  CK     Status: Abnormal   Collection Time: 02/06/16 10:15 AM  Result Value Ref Range   Total CK 8,749 (H) 49 - 397 U/L    Comment: RESULTS CONFIRMED BY MANUAL DILUTION  Renal function panel     Status: None   Collection Time: 02/07/16  5:30 AM  Result Value Ref Range   Sodium 140 135 - 145 mmol/L   Potassium 4.1 3.5 - 5.1 mmol/L   Chloride 107 101 - 111 mmol/L   CO2 28 22 - 32 mmol/L   Glucose, Bld 93 65 - 99 mg/dL   BUN 15 6 - 20 mg/dL   Creatinine, Ser 0.95 0.61 - 1.24 mg/dL   Calcium 9.2 8.9 - 10.3 mg/dL   Phosphorus 2.7 2.5 - 4.6 mg/dL   Albumin 4.0 3.5 - 5.0 g/dL   GFR calc non Af Amer >60 >60 mL/min   GFR calc Af Amer >60 >60 mL/min    Comment: (NOTE) The eGFR has been calculated using the CKD EPI equation. This calculation has not been validated in all clinical situations. eGFR's persistently <60 mL/min signify possible  Chronic Kidney Disease.    Anion gap 5 5 - 15  CK     Status: Abnormal   Collection Time: 02/07/16  5:30 AM  Result Value Ref Range   Total CK 4,114 (H) 49 - 397 U/L    Comment: RESULTS CONFIRMED BY MANUAL DILUTION  Comprehensive metabolic panel     Status: Abnormal   Collection Time: 02/07/16  5:30 AM  Result Value Ref Range   Sodium 140 135 - 145 mmol/L   Potassium 4.2 3.5 - 5.1 mmol/L   Chloride 107 101 - 111 mmol/L   CO2 28 22 - 32 mmol/L   Glucose, Bld 90 65 - 99 mg/dL   BUN 16 6 - 20 mg/dL   Creatinine, Ser 0.95 0.61 - 1.24 mg/dL   Calcium 9.1 8.9 - 10.3 mg/dL   Total Protein 7.1 6.5 - 8.1 g/dL   Albumin 4.0 3.5 - 5.0 g/dL   AST 211 (H) 15 - 41 U/L   ALT 73 (H) 17 - 63 U/L   Alkaline Phosphatase 42 38 - 126 U/L   Total Bilirubin 1.2 0.3 - 1.2 mg/dL   GFR calc non Af Amer >60 >60 mL/min   GFR calc  Af Amer >60 >60 mL/min    Comment: (NOTE) The eGFR has been calculated using the CKD EPI equation. This calculation has not been validated in all clinical situations. eGFR's persistently <60 mL/min signify possible Chronic Kidney Disease.    Anion gap 5 5 - 15    Current Facility-Administered Medications  Medication Dose Route Frequency Provider Last Rate Last Dose  . folic acid (FOLVITE) tablet 1 mg  1 mg Oral Daily Karmen Bongo, MD   1 mg at 02/06/16 0918  . heparin injection 5,000 Units  5,000 Units Subcutaneous Q8H Bonnell Public, MD   5,000 Units at 02/06/16 [phone]  . hydrOXYzine (ATARAX/VISTARIL) tablet 25 mg  25 mg Oral BID PC Mojeed Akintayo, MD   25 mg at 02/06/16 0918  . lactated ringers infusion   Intravenous Continuous Bonnell Public, MD 100 mL/hr at 02/07/16 0537    . LORazepam (ATIVAN) tablet 1 mg  1 mg Oral Q6H PRN Karmen Bongo, MD       Or  . LORazepam (ATIVAN) injection 1 mg  1 mg Intravenous Q6H PRN Karmen Bongo, MD   1 mg at 02/06/16 0608  . multivitamin with minerals tablet 1 tablet  1 tablet Oral Daily Karmen Bongo, MD   1 tablet at  02/06/16 [phone]  . nicotine (NICODERM CQ - dosed in mg/24 hours) patch 21 mg  21 mg Transdermal Daily Bonnell Public, MD      . OLANZapine zydis (ZYPREXA) disintegrating tablet 5 mg  5 mg Oral BID PC Corena Pilgrim, MD   5 mg at 02/06/16 0918  . ondansetron (ZOFRAN) tablet 4 mg  4 mg Oral Q6H PRN Karmen Bongo, MD       Or  . ondansetron [facility]) injection 4 mg  4 mg Intravenous Q6H PRN Karmen Bongo, MD      . sodium chloride flush (NS) 0.9 % injection 3 mL  3 mL Intravenous Q12H Karmen Bongo, MD   3 mL at 02/05/16 0018  . thiamine (VITAMIN B-1) tablet 100 mg  100 mg Oral Daily Karmen Bongo, MD   100 mg at 02/06/16 1601   Or  . thiamine (B-1) injection 100 mg  100 mg Intravenous Daily Karmen Bongo, MD        Musculoskeletal: Strength & Muscle Tone: within normal limits Gait & Station: normal Patient leans: N/A  Psychiatric Specialty Exam: Physical Exam  Psychiatric: His speech is normal. Judgment normal. His mood appears anxious. He is agitated, aggressive and actively hallucinating. Thought content is paranoid and delusional. Cognition and memory are normal.    Review of Systems  Constitutional: Negative.   HENT: Negative.   Eyes: Negative.   Cardiovascular: Negative.   Gastrointestinal: Negative.   Skin: Negative.   Neurological: Negative.   Endo/Heme/Allergies: Negative.   Psychiatric/Behavioral: Positive for hallucinations and substance abuse. The patient has insomnia.     Blood pressure 124/84, pulse (!) 57, temperature 98.1 F (36.7 C), temperature source Oral, resp. rate 18, height '5\' 11"'$  (1.803 m), weight 86.2 kg (190 lb), SpO2 100 %.Body mass index is 26.5 kg/m.  General Appearance: Casual  Eye Contact:  Good  Speech:  Clear and Coherent, Normal Rate and Pressured  Volume:  Normal  Mood:  Euthymic  Affect:  Appropriate and Congruent  Thought Process:  Coherent and Goal Directed  Orientation:  Full (Time, Place, and Person)  Thought Content:  Logical and  gaol directed.  Suicidal Thoughts:  No  Homicidal Thoughts:  No  Memory:  Immediate;   Good Recent;   Fair Remote;   Fair  Judgement:  Good  Insight:  Good  Psychomotor Activity:  Normal  Concentration:  Attention Span: Good  Recall:  [place] of Knowledge:  Fair  Language:  Good  Akathisia:  No  Handed:  Right  AIMS (if indicated):     Assets:  Communication Skills Desire for Improvement Financial Resources/Insurance Housing Intimacy Leisure Time Physical Health Resilience Social Support Talents/Skills Transportation Vocational/Educational  ADL's:  Intact  Cognition:  WNL  Sleep:   poor     Treatment Plan Summary: Patient has been free from psychotic agitation, denied auditory and visual hallucinations and no evidence of suicide or homicide ideation, intention or plans. He endorses cannabis abuse and alcohol abuse. He is medically improved with renal functions and creatinine kinase and medically stable at this time.   Plan/Recommendation Case discussed with psych social service and patient sister who is at bed side Patient does not meet criteria for acute psychiatric admission as he is psychiatrically cleared and will discharge to his sister's care and out patient psychiatric and substance abuse counseling services as needed Continue Zyprexa Zydis 19m BID PRN for psychosis/delusion Continue Hydroxyzine 231mbid for anxiety Referred to out patient psychiatric services and substance abuse treatment   Disposition: Rescind involuntary commitment as he is not dangerous to himself or others No evidence of imminent risk to self or others at present.   Patient does not meet criteria for psychiatric inpatient admission. Supportive therapy provided about ongoing stressors. Case discussed with Unit Social worker who is also physically present for this psychiatric evaluation.   JAAmbrose FinlandMD 02/07/2016 12:27 PM

## 2016-02-07 NOTE — Progress Notes (Addendum)
LCSWA met with patient, pt. sister and Psych. MD at bedside. Patient receptive to assessment.  Patient No longer needs inpatient psych. Medically cleared by Psychiatrist MD.     Kathrin Greathouse, Latanya Presser, MSW Clinical Social Worker 5E and Psychiatric Service Line [phone] 02/07/2016  9:00 AM
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
## 2016-02-08 LAB — FANA STAINING PATTERNS: Homogeneous Pattern: 1:80 {titer}

## 2016-02-08 LAB — ANTINUCLEAR ANTIBODIES, IFA: ANA Ab, IFA: POSITIVE — AB

## 2017-02-09 ENCOUNTER — Encounter (HOSPITAL_COMMUNITY): Payer: Self-pay | Admitting: *Deleted

## 2017-02-09 ENCOUNTER — Emergency Department (HOSPITAL_COMMUNITY)
Admission: EM | Admit: 2017-02-09 | Discharge: 2017-02-09 | Disposition: A | Discharge status: Home / Self Care | Payer: Self-pay | Attending: Emergency Medicine | Admitting: Emergency Medicine

## 2017-02-09 DIAGNOSIS — R112 Nausea with vomiting, unspecified: Secondary | ICD-10-CM | POA: Insufficient documentation

## 2017-02-09 DIAGNOSIS — R1031 Right lower quadrant pain: Secondary | ICD-10-CM | POA: Insufficient documentation

## 2017-02-09 DIAGNOSIS — Z79899 Other long term (current) drug therapy: Secondary | ICD-10-CM | POA: Insufficient documentation

## 2017-02-09 DIAGNOSIS — F17210 Nicotine dependence, cigarettes, uncomplicated: Secondary | ICD-10-CM | POA: Insufficient documentation

## 2017-02-09 DIAGNOSIS — K4090 Unilateral inguinal hernia, without obstruction or gangrene, not specified as recurrent: Secondary | ICD-10-CM | POA: Insufficient documentation

## 2017-02-09 MED ORDER — HYDROMORPHONE HCL 1 MG/ML IJ SOLN
1.0000 mg | Freq: Once | INTRAMUSCULAR | Status: AC
  Administered 2017-02-09: 1 mg via INTRAVENOUS
  Filled 2017-02-09: qty 1

## 2017-02-09 MED ORDER — SODIUM CHLORIDE 0.9 % IV BOLUS (SEPSIS)
1000.0000 mL | Freq: Once | INTRAVENOUS | Status: AC
  Administered 2017-02-09: 1000 mL via INTRAVENOUS

## 2017-02-09 MED ORDER — ONDANSETRON HCL 4 MG/2ML IJ SOLN
4.0000 mg | Freq: Once | INTRAMUSCULAR | Status: AC
  Administered 2017-02-09: 4 mg via INTRAVENOUS
  Filled 2017-02-09: qty 2

## 2017-02-09 MED ORDER — DOCUSATE SODIUM 50 MG PO CAPS: 50.0000 mg | ORAL_CAPSULE | Freq: Two times a day (BID) | ORAL | 0 refills | Status: AC

## 2017-02-09 NOTE — ED Triage Notes (Signed)
Per EMS, pt has RLQ abdominal hernia. Pain started 2 hours ago. Pt has hx of hernia, last had the issue 4 months ago.

## 2017-02-09 NOTE — ED Notes (Signed)
Pt was given sprite and saltine crackers for po challenge---- tolerated well.

## 2017-02-09 NOTE — ED Notes (Signed)
Bed: WA10 Expected date:  Expected time:  Means of arrival:  Comments: EMS 

## 2017-02-09 NOTE — Discharge Instructions (Signed)
Please follow with surgery to discuss further treatment of her hernia and to prevent this from happening again. Please restrain from straining until then. I'm placing on stool softeners to prevent from straining while using the bathroom. I provided educational information below   An inguinal hernia is when fat or the intestines push through the area where the leg meets the lower belly (groin) and make a rounded lump (bulge). This condition happens over time. There are three types of inguinal hernias. These types include: Hernias that can be pushed back into the belly (are reducible). Hernias that cannot be pushed back into the belly (are incarcerated). Hernias that cannot be pushed back into the belly and lose their blood supply (get strangulated). This type needs emergency surgery.  Follow these instructions at home: Lifestyle Drink enough fluid to keep your urine (pee) clear or pale yellow. Eat plenty of fruits, vegetables, and whole grains. These have a lot of fiber. Talk with your doctor if you have questions. Avoid lifting heavy objects. Avoid standing for long periods of time. Do not use tobacco products. These include cigarettes, chewing tobacco, or e-cigarettes. If you need help quitting, ask your doctor. Try to stay at a healthy weight. General instructions Do not try to force the hernia back in. Watch your hernia for any changes in color or size. Let your doctor know if there are any changes. Take over-the-counter and prescription medicines only as told by your doctor. Keep all follow-up visits as told by your doctor. This is important. Contact a doctor if: You have a fever. You have new symptoms. Your symptoms get worse. Get help right away if: The area where the legs meets the lower belly has: Pain that gets worse suddenly. A bulge that gets bigger suddenly and does not go down. A bulge that turns red or purple. A bulge that is painful to the touch. You are a man and your  scrotum: Suddenly feels painful. Suddenly changes in size. You feel sick to your stomach (nauseous) and this feeling does not go away. You throw up (vomit) and this keeps happening. You feel your heart beating a lot more quickly than normal. You cannot poop (have a bowel movement) or pass gas.

## 2017-02-09 NOTE — ED Provider Notes (Signed)
[facility]-EMERGENCY DEPT Provider Note   CSN: [id number] Arrival date & time: 02/09/17  1754     History   Chief Complaint Chief Complaint  Patient presents with  . Hernia    HPI Omar Ravelingnthony Starnes is a 46 y.o. male who presents to the emergency department today for known right-sided inguinal hernia. The hernia became bulging, hard, tender around 2 PM today when the patient was sitting down at his kitchen counter to eat. Patient is now experiencing significant pain which she rates 10/10 with associated nausea and one episode of vomiting. Patient states that he has never had repair to hernia at the site. He says he does feel a bulge in this area frequently but it is usually soft and he is able to reduce it himself. Patient has not tried anything prior to arrival. Patient notes that it is worse with palpation, coughing, walking. He has never seen a Careers advisersurgeon for this.   HPI  Past Medical History:  Diagnosis Date  . Asthma     Patient Active Problem List   Diagnosis Date Noted  . Psychosis 02/05/2016  . Cannabis use disorder, severe, dependence (HCC) 02/05/2016  . 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 Acute renal failure (ARF) (HCC) 02/04/2016    History reviewed. No pertinent surgical history.     Home Medications    Prior to Admission medications   Medication Sig Start Date End Date Taking? Authorizing Provider  albuterol (PROVENTIL HFA;VENTOLIN HFA) 108 (90 Base) MCG/ACT inhaler Inhale 1-2 puffs into the lungs every 6 (six) hours as needed for wheezing or shortness of breath. 10/05/15   Bethel BornGekas, Kelly Marie, PA-C  fluticasone furoate-vilanterol (BREO ELLIPTA) 100-25 MCG/INH AEPB Inhale 1 puff into the lungs daily. Patient not taking: Reported on 02/04/2016 10/05/15   Bethel BornGekas, Kelly Marie, PA-C  OLANZapine zydis (ZYPREXA) 5 MG disintegrating tablet Take 1 tablet (5 mg total) by mouth 2 (two) times daily after a meal. 02/07/16   Barnetta Chapelgbata, Sylvester I, MD     Family History No family history on file.  Social History Social History  Substance Use Topics  . Smoking status: Current Every Day Smoker  . Smokeless tobacco: Not on file  . Alcohol use No     Allergies   Patient has no known allergies.   Review of Systems Review of Systems  All other systems reviewed and are negative.    Physical Exam Updated Vital Signs BP 132/85   Pulse 60   Temp 97.9 F (36.6 C) (Oral)   Resp 18   SpO2 97%   Physical Exam  Constitutional: He appears well-developed and well-nourished.  Nontoxic-appearing.  HENT:  Head: Normocephalic and atraumatic.  Right Ear: External ear normal.  Left Ear: External ear normal.  Eyes: Conjunctivae and lids are normal. Right eye exhibits no discharge. Left eye exhibits no discharge. No scleral icterus.  Pulmonary/Chest: Effort normal. No respiratory distress.  Abdominal: Normal appearance and bowel sounds are normal. There is no tenderness. There is no rigidity, no rebound and no guarding. A hernia is present. Hernia confirmed positive in the right inguinal area (6 cm bulge, firm, hard, tender ). Hernia confirmed negative in the left inguinal area.  Genitourinary: Penis normal. Circumcised.  Genitourinary Comments: No overlying skin changes  Neurological: He is alert.  Skin: Skin is warm, dry and intact. No bruising and no ecchymosis noted. No erythema. No pallor.  Psychiatric: His affect is angry.  Shouting at staff  Nursing note and vitals reviewed.  ED Treatments / Results  Labs (all labs ordered are listed, but only abnormal results are displayed) Labs Reviewed - No data to display  EKG  EKG Interpretation None       Radiology No results found.  Procedures Procedures (including critical care time)  Medications Ordered in ED Medications  sodium chloride 0.9 % bolus 1,000 mL (0 mLs Intravenous Stopped 02/09/17 1948)  ondansetron (ZOFRAN) injection 4 mg (4 mg Intravenous Given  02/09/17 1833)  HYDROmorphone (DILAUDID) injection 1 mg (1 mg Intravenous Given 02/09/17 1834)     Initial Impression / Assessment and Plan / ED Course  I have reviewed the triage vital signs and the nursing notes.  Pertinent labs & imaging results that were available during my care of the patient were reviewed by me and considered in my medical decision making (see chart for details).     46 year old male with incarcerated right inguinal hernia but on exam. Patient placed in Trendelenburg position, given ice pack, pain medication and nausea medication. Hernia reduced with the assistance of Dr. Erma HeritageIsaacs. Patient's pain relieved and she is resting comfortably. As the patient had one episode of vomiting will by mouth challenge the patient to make sure that he is stable to go home.  Patient tolerated by mouth challenge. The evaluation does not show pathology that would require ongoing emergent intervention or inpatient treatment. I advised the patient to follow-up with surgery for further evaluation and to discuss possible treatment with surgery versus conservative therapy. Will place the patient on stool softeners. I advised the patient to return to the emergency department with new or worsening symptoms or new concerns. Specific return precautions discussed. The patient verbalized understanding and agreement with plan. All questions answered. No further questions at this time. The patient is hemodynamically stable, mentating appropriately and appears safe for discharge.  Final Clinical Impressions(s) / ED Diagnoses   Final diagnoses:  Right inguinal hernia    New Prescriptions New Prescriptions   No medications on file     Princella PellegriniMaczis, Michael M, PA-C 02/09/17 Delice [id number]    Isaacs, Cameron, MD 02/10/17 1318

## 2017-08-10 ENCOUNTER — Emergency Department (HOSPITAL_COMMUNITY)
Admission: EM | Admit: 2017-08-10 | Discharge: 2017-08-11 | Disposition: A | Discharge status: Home / Self Care | Payer: Self-pay | Attending: Emergency Medicine | Admitting: Emergency Medicine

## 2017-08-10 ENCOUNTER — Other Ambulatory Visit: Payer: Self-pay

## 2017-08-10 ENCOUNTER — Encounter (HOSPITAL_COMMUNITY): Payer: Self-pay

## 2017-08-10 DIAGNOSIS — J45909 Unspecified asthma, uncomplicated: Secondary | ICD-10-CM | POA: Insufficient documentation

## 2017-08-10 DIAGNOSIS — R1031 Right lower quadrant pain: Secondary | ICD-10-CM

## 2017-08-10 DIAGNOSIS — K469 Unspecified abdominal hernia without obstruction or gangrene: Secondary | ICD-10-CM | POA: Insufficient documentation

## 2017-08-10 DIAGNOSIS — F17210 Nicotine dependence, cigarettes, uncomplicated: Secondary | ICD-10-CM | POA: Insufficient documentation

## 2017-08-10 HISTORY — DX: Unspecified abdominal hernia without obstruction or gangrene: K46.9

## 2017-08-10 NOTE — ED Triage Notes (Signed)
States abdominal pain for 2 hours getting worse states has hernia but has not got surgery yet unalbe to tell me why.

## 2017-08-10 NOTE — ED Notes (Signed)
Writer walked into room to introduce themselves and inform pt there was urine and blood labs ordered. Pt stated "I am in a lot of pain and that is the first thing that needs to be handled". Writer said "ok". Pt began yelling "No B you do not understand. I need pain medicine. I'm in pain. If y'all don't do something about this pain y'all gonna have to lock me down to this bed." Writer told pt to stop yelling and cussing, Clinical research associatewriter would notify the MD and RN that pt was in pain. Pt also stated "I can't pee or shit because of this hernia".

## 2017-08-10 NOTE — ED Notes (Signed)
Refused to stay in wheelchair so I could get his vital signs.

## 2017-08-11 ENCOUNTER — Other Ambulatory Visit: Payer: Self-pay

## 2017-08-11 ENCOUNTER — Emergency Department (HOSPITAL_COMMUNITY): Payer: Self-pay

## 2017-08-11 ENCOUNTER — Encounter (HOSPITAL_COMMUNITY): Payer: Self-pay | Admitting: Radiology

## 2017-08-11 MED ORDER — ONDANSETRON HCL 4 MG/2ML IJ SOLN
4.0000 mg | Freq: Once | INTRAMUSCULAR | Status: AC
Start: 2017-08-11 — End: 2017-08-11
  Administered 2017-08-11: 4 mg via INTRAVENOUS
  Filled 2017-08-11: qty 2

## 2017-08-11 MED ORDER — HYDROMORPHONE HCL 1 MG/ML IJ SOLN
1.0000 mg | Freq: Once | INTRAMUSCULAR | Status: AC
  Administered 2017-08-11: 1 mg via INTRAVENOUS
  Filled 2017-08-11: qty 1

## 2017-08-11 MED ORDER — IOPAMIDOL (ISOVUE-300) INJECTION 61%
INTRAVENOUS | Status: AC
  Filled 2017-08-11: qty 100

## 2017-08-11 MED ORDER — IOPAMIDOL (ISOVUE-300) INJECTION 61%: 100.0000 mL | Freq: Once | INTRAVENOUS | Status: DC | PRN

## 2017-08-11 MED ORDER — FLUTICASONE PROPIONATE 50 MCG/ACT NA SUSP: 1.0000 | Freq: Every day | NASAL | 2 refills | Status: AC

## 2017-08-11 NOTE — ED Notes (Signed)
Pt has taken EKG leads, pulse ox and bp cuff off. Pt stated "I can't do this anymore I have to get in the floor." Pt lying in bed currently.

## 2017-08-11 NOTE — ED Notes (Signed)
Patient talked to PA about not wanting a ct

## 2017-08-11 NOTE — ED Notes (Signed)
Writer went to pt's room to remove IV. Pt saw writer coming and tore IV out of arm and stated "I can do this shit myself I don't need y'all's help". Notified RN.

## 2017-08-11 NOTE — ED Notes (Signed)
Pt still refusing to give urine sample.

## 2017-08-11 NOTE — ED Notes (Signed)
Writer went to room to obtain lab work, but pt refused. Pt started stating he "wanted to leave". He didn't "need any blood drawn," he "needed to leave now". Writer asked pt if he "wanted to stay to complete tests, get his results and speak with the MD or PA" Pt stated- "No I do not want to, I want to leave". Pt getting very loud and agitated. Writer informed RN and PA of pt leaving AMA.

## 2017-08-11 NOTE — ED Notes (Signed)
Requested urine from patient. Patient continue getting up and can hardly walk. Patient was asked to stay in bed and use call bell.

## 2017-08-11 NOTE — ED Provider Notes (Signed)
[facility]-EMERGENCY DEPT Provider Note   CSN: [id number] Arrival date & time: 08/10/17  2324     History   Chief Complaint Chief Complaint  Patient presents with  . Abdominal Pain    HPI Omar Nelson is a 47 y.o. male.  Patient with past medical history remarkable for psychosis presents to the emergency department with a chief complaint of right-sided abdominal pain.  He reports having history of a hernia.  He states that he is having increased pain.  He feels like it has popped out again.  He reports 10 out of 10 pain.  He denies any nausea, vomiting, or diarrhea.  He denies any other associated symptoms.   The history is provided by the patient. No language interpreter was used.    Past Medical History:  Diagnosis Date  . Asthma   . Hernia, abdominal     Patient Active Problem List   Diagnosis Date Noted  . Psychosis (HCC) 02/05/2016  . Cannabis use disorder, severe, dependence (HCC) 02/05/2016  . 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 Acute renal failure (ARF) (HCC) 02/04/2016    No past surgical history on file.     Home Medications    Prior to Admission medications   Medication Sig Start Date End Date Taking? Authorizing Provider  albuterol (PROVENTIL HFA;VENTOLIN HFA) 108 (90 Base) MCG/ACT inhaler Inhale 1-2 puffs into the lungs every 6 (six) hours as needed for wheezing or shortness of breath. Patient not taking: Reported on 02/09/2017 10/05/15   Bethel Born, PA-C  docusate sodium (COLACE) 50 MG capsule Take 1 capsule (50 mg total) by mouth 2 (two) times daily. Patient not taking: Reported on 08/11/2017 02/09/17   Maczis, Elmer Sow, PA-C  OLANZapine zydis (ZYPREXA) 5 MG disintegrating tablet Take 1 tablet (5 mg total) by mouth 2 (two) times daily after a meal. Patient not taking: Reported on 02/09/2017 02/07/16   Barnetta Chapel, MD    Family History No family history on file.  Social  History Social History   Tobacco Use  . Smoking status: Current Every Day Smoker  Substance Use Topics  . Alcohol use: No  . Drug use: No     Allergies   Patient has no known allergies.   Review of Systems Review of Systems  All other systems reviewed and are negative.    Physical Exam Updated Vital Signs BP 125/78   Pulse 64   Resp 15   Ht 6\' 2"  (1.88 m)   Wt 86.2 kg (190 lb)   SpO2 96%   BMI 24.39 kg/m   Physical Exam  Constitutional: He is oriented to person, place, and time. He appears well-developed and well-nourished.  HENT:  Head: Normocephalic and atraumatic.  Eyes: Conjunctivae and EOM are normal. Pupils are equal, round, and reactive to light. Right eye exhibits no discharge. Left eye exhibits no discharge. No scleral icterus.  Neck: Normal range of motion. Neck supple. No JVD present.  Cardiovascular: Normal rate, regular rhythm and normal heart sounds. Exam reveals no gallop and no friction rub.  No murmur heard. Pulmonary/Chest: Effort normal and breath sounds normal. No respiratory distress. He has no wheezes. He has no rales. He exhibits no tenderness.  Abdominal: Soft. He exhibits no distension and no mass. There is no tenderness. There is no rebound and no guarding.  Musculoskeletal: Normal range of motion. He exhibits no edema or tenderness.  Neurological: He is alert and oriented to person, place, and  time.  Skin: Skin is warm and dry.  Psychiatric: He has a normal mood and affect. His behavior is normal. Judgment and thought content normal.  Nursing note and vitals reviewed.    ED Treatments / Results  Labs (all labs ordered are listed, but only abnormal results are displayed) Labs Reviewed  LIPASE, BLOOD  COMPREHENSIVE METABOLIC PANEL  CBC  URINALYSIS, ROUTINE W REFLEX MICROSCOPIC    EKG  EKG Interpretation None       Radiology No results found.  Procedures Hernia reduction Date/Time: 08/11/2017 1:06 AM Performed by:  Roxy HorsemanBrowning, Arnella Pralle, PA-C Authorized by: Roxy HorsemanBrowning, Adaly Puder, PA-C  Consent: Verbal consent obtained. Risks and benefits: risks, benefits and alternatives were discussed Consent given by: patient Patient understanding: patient states understanding of the procedure being performed Patient consent: the patient's understanding of the procedure matches consent given Procedure consent: procedure consent matches procedure scheduled Relevant documents: relevant documents present and verified Test results: test results available and properly labeled Site marked: the operative site was marked Imaging studies: imaging studies available Required items: required blood products, implants, devices, and special equipment available Patient identity confirmed: verbally with patient Time out: Immediately prior to procedure a "time out" was called to verify the correct patient, procedure, equipment, support staff and site/side marked as required. Local anesthesia used: no  Anesthesia: Local anesthesia used: no  Sedation: Patient sedated: no  Comments: Firm pressure was applied to the patient's small right sided abdominal hernia, there was good reduction    (including critical care time)  Medications Ordered in ED Medications  HYDROmorphone (DILAUDID) injection 1 mg (not administered)     Initial Impression / Assessment and Plan / ED Course  I have reviewed the triage vital signs and the nursing notes.  Pertinent labs & imaging results that were available during my care of the patient were reviewed by me and considered in my medical decision making (see chart for details).      Patient with hx of recurrent right sided abdominal hernia.  This was easily reduced at the bedside.  There was no redness or excessive warmth of the skin.  Doubt complication.  Patient requests CT for further evaluation.  Will check CT for worsening hernia.  3:17 AM Patient requesting to leave.  VSS.  Hernia reduced.  Pain  has resolved.  I encouraged [facility] follow-up.    Final Clinical Impressions(s) / ED Diagnoses   Final diagnoses:  Right lower quadrant abdominal pain    ED Discharge Orders    None         Roxy HorsemanBrowning, Zaylyn Bergdoll, PA-C 08/11/17 [id number]    Gilda CreasePollina, Christopher J, MD 08/11/17 [phone]

## 2017-08-11 NOTE — ED Notes (Signed)
Lab states blood was not sent. Blood was sent around 0028. Lab contacted to come stick patient.

## 2017-08-13 ENCOUNTER — Emergency Department (HOSPITAL_COMMUNITY)
Admission: EM | Admit: 2017-08-13 | Discharge: 2017-08-13 | Disposition: A | Discharge status: Home / Self Care | Payer: Self-pay | Attending: Emergency Medicine | Admitting: Emergency Medicine

## 2017-08-13 DIAGNOSIS — K4090 Unilateral inguinal hernia, without obstruction or gangrene, not specified as recurrent: Secondary | ICD-10-CM | POA: Insufficient documentation

## 2017-08-13 DIAGNOSIS — K4091 Unilateral inguinal hernia, without obstruction or gangrene, recurrent: Secondary | ICD-10-CM

## 2017-08-13 DIAGNOSIS — J45909 Unspecified asthma, uncomplicated: Secondary | ICD-10-CM | POA: Insufficient documentation

## 2017-08-13 DIAGNOSIS — F17200 Nicotine dependence, unspecified, uncomplicated: Secondary | ICD-10-CM | POA: Insufficient documentation

## 2017-08-13 MED ORDER — IBUPROFEN 800 MG PO TABS
800.0000 mg | ORAL_TABLET | Freq: Once | ORAL | Status: DC
  Filled 2017-08-13: qty 1

## 2017-08-13 MED ORDER — IBUPROFEN 800 MG PO TABS: 800.0000 mg | ORAL_TABLET | Freq: Three times a day (TID) | ORAL | 0 refills | Status: DC

## 2017-08-13 NOTE — Discharge Instructions (Signed)
You can follow-up with general surgery about your hernia.

## 2017-08-13 NOTE — ED Provider Notes (Signed)
[facility]-EMERGENCY DEPT Provider Note   CSN: [id number] Arrival date & time: 08/13/17  0234     History   Chief Complaint Chief Complaint  Patient presents with  . Hernia Pain    HPI Omar Nelson is a 47 y.o. male.  The history is provided by the patient and medical records.     47 year old male with history of asthma and right inguinal hernia here with continued hernia pain.  He was seen here a few days ago for same, initially plan for CT scan however after receiving pain medication he left.  The patient comes back this evening and is cursing at staff when he was asked to go to the waiting room.  Upon entering his room he is asleep and covered up with blankets.  Reports continued pain in the right groin.  He denies any nausea or vomiting.  No fever or chills.  He has not had any follow-up with general surgery about having this hernia repair, when I asked why not he states "I do not want to".  Past Medical History:  Diagnosis Date  . Asthma   . Hernia, abdominal     Patient Active Problem List   Diagnosis Date Noted  . Psychosis (HCC) 02/05/2016  . Cannabis use disorder, severe, dependence (HCC) 02/05/2016  . 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 Acute renal failure (ARF) (HCC) 02/04/2016    No past surgical history on file.     Home Medications    Prior to Admission medications   Medication Sig Start Date End Date Taking? Authorizing Provider  albuterol (PROVENTIL HFA;VENTOLIN HFA) 108 (90 Base) MCG/ACT inhaler Inhale 1-2 puffs into the lungs every 6 (six) hours as needed for wheezing or shortness of breath. Patient not taking: Reported on 02/09/2017 10/05/15   Bethel BornGekas, Kelly Marie, PA-C  docusate sodium (COLACE) 50 MG capsule Take 1 capsule (50 mg total) by mouth 2 (two) times daily. Patient not taking: Reported on 08/11/2017 02/09/17   Jacinto HalimMaczis, Michael M, PA-C  fluticasone [facility](FLONASE) 50 MCG/ACT nasal spray Place  1 spray into both nostrils daily. 08/11/17   Roxy HorsemanBrowning, Robert, PA-C  OLANZapine zydis (ZYPREXA) 5 MG disintegrating tablet Take 1 tablet (5 mg total) by mouth 2 (two) times daily after a meal. Patient not taking: Reported on 02/09/2017 02/07/16   Barnetta Chapelgbata, Sylvester I, MD    Family History No family history on file.  Social History Social History   Tobacco Use  . Smoking status: Current Every Day Smoker  Substance Use Topics  . Alcohol use: No  . Drug use: No     Allergies   Patient has no known allergies.   Review of Systems Review of Systems  Gastrointestinal:       Hernia  All other systems reviewed and are negative.    Physical Exam Updated Vital Signs BP 102/72 (BP Location: Right Arm)   Pulse 66   Temp 98.4 F (36.9 C) (Oral)   Resp 18   SpO2 96%   Physical Exam  Constitutional: He is oriented to person, place, and time. He appears well-developed and well-nourished.  HENT:  Head: Normocephalic and atraumatic.  Mouth/Throat: Oropharynx is clear and moist.  Eyes: Conjunctivae and EOM are normal. Pupils are equal, round, and reactive to light.  Neck: Normal range of motion.  Cardiovascular: Normal rate, regular rhythm and normal heart sounds.  Pulmonary/Chest: Effort normal and breath sounds normal.  Abdominal: Soft. Bowel sounds are normal. A hernia is present.  Hernia confirmed positive in the right inguinal area.  Genitourinary:  Genitourinary Comments: Right inguinal hernia noted that is easily reducible, no overlying skin changes  Musculoskeletal: Normal range of motion.  Neurological: He is alert and oriented to person, place, and time.  Skin: Skin is warm and dry.  Psychiatric: He has a normal mood and affect.  Nursing note and vitals reviewed.    ED Treatments / Results  Labs (all labs ordered are listed, but only abnormal results are displayed) Labs Reviewed - No data to display  EKG  EKG Interpretation None       Radiology No results  found.  Procedures Procedures (including critical care time)  Medications Ordered in ED Medications  ibuprofen (ADVIL,MOTRIN) tablet 800 mg (800 mg Oral Refused 08/13/17 0622)     Initial Impression / Assessment and Plan / ED Course  I have reviewed the triage vital signs and the nursing notes.  Pertinent labs & imaging results that were available during my care of the patient were reviewed by me and considered in my medical decision making (see chart for details).  48 year old male here with right inguinal pain secondary to hernia.  This is been present for several months.  Has not yet seen general surgery because he states "he does not want to".  He is sleeping on my initial assessment, once awoken he answers limited questions.  His hernia is easily reducible without any overlying skin changes.  Do not suspect acute incarceration.  Feel he can be discharged with outpatient follow-up.  Discharged home in stable condition.  Final Clinical Impressions(s) / ED Diagnoses   Final diagnoses:  Unilateral recurrent inguinal hernia without obstruction or gangrene    ED Discharge Orders        Ordered    ibuprofen (ADVIL,MOTRIN) 800 MG tablet  3 times daily     08/13/17 0612       Garlon Hatchet, PA-C 08/13/17 1610    Shon Baton, MD 08/13/17 [phone]

## 2017-08-13 NOTE — ED Notes (Signed)
Pt demanded to see a doctor and refused to wait in lobby. Pt stated "Fuck that Im not waiting in the lobby." Hospital Security notified.

## 2017-08-13 NOTE — ED Notes (Signed)
Pt ambulatory independently, escorted by hospital security to WR.

## 2017-08-13 NOTE — ED Notes (Signed)
Bed: [id number] Expected date: 08/13/17 Expected time: 7:00 AM Means of arrival:  Comments:

## 2017-08-13 NOTE — ED Notes (Addendum)
Patient provided a sandwich and sprite at his request. Patient refusing vital signs and discharge papers and to sign for his papers. Patient states, "I have 19 of those shits at home, I don't need that." Patient calling a ride. Patient informed he is free to leave. Security near room as precaution.

## 2017-08-13 NOTE — ED Triage Notes (Signed)
Pt reports 10/10 hernia pain localized to his left groin. Pt states that he does not have access to the pain meds he was Rx'd. Pt A+OX4.

## 2017-08-13 NOTE — ED Notes (Signed)
Security escorting patient to lobby

## 2017-08-30 ENCOUNTER — Encounter (HOSPITAL_COMMUNITY): Payer: Self-pay

## 2017-08-30 ENCOUNTER — Other Ambulatory Visit: Payer: Self-pay

## 2017-08-30 ENCOUNTER — Emergency Department (HOSPITAL_COMMUNITY)
Admission: EM | Admit: 2017-08-30 | Discharge: 2017-08-31 | Disposition: A | Discharge status: Home / Self Care | Payer: Self-pay | Attending: Emergency Medicine | Admitting: Emergency Medicine

## 2017-08-30 DIAGNOSIS — J45909 Unspecified asthma, uncomplicated: Secondary | ICD-10-CM | POA: Insufficient documentation

## 2017-08-30 DIAGNOSIS — K4030 Unilateral inguinal hernia, with obstruction, without gangrene, not specified as recurrent: Secondary | ICD-10-CM | POA: Insufficient documentation

## 2017-08-30 DIAGNOSIS — F17200 Nicotine dependence, unspecified, uncomplicated: Secondary | ICD-10-CM | POA: Insufficient documentation

## 2017-08-30 MED ORDER — AZITHROMYCIN 250 MG PO TABS
500.0000 mg | ORAL_TABLET | Freq: Once | ORAL | Status: AC
  Administered 2017-08-31: 500 mg via ORAL
  Filled 2017-08-30: qty 2

## 2017-08-30 MED ORDER — PROPOFOL 10 MG/ML IV BOLUS
INTRAVENOUS | Status: AC | PRN
Start: 2017-08-30 — End: 2017-08-30
  Administered 2017-08-30 (×2): 20 mg via INTRAVENOUS
  Administered 2017-08-30: 45 mg via INTRAVENOUS

## 2017-08-30 MED ORDER — AZITHROMYCIN 250 MG PO TABS: 250.0000 mg | ORAL_TABLET | Freq: Every day | ORAL | 0 refills | Status: AC

## 2017-08-30 MED ORDER — HYDROMORPHONE HCL 1 MG/ML IJ SOLN
1.0000 mg | Freq: Once | INTRAMUSCULAR | Status: AC
  Administered 2017-08-30: 1 mg via INTRAVENOUS
  Filled 2017-08-30: qty 1

## 2017-08-30 MED ORDER — PROPOFOL 10 MG/ML IV BOLUS
0.5000 mg/kg | Freq: Once | INTRAVENOUS | Status: AC
  Administered 2017-08-30: 43.1 mg via INTRAVENOUS
  Filled 2017-08-30: qty 20

## 2017-08-30 MED ORDER — ONDANSETRON HCL 4 MG/2ML IJ SOLN
4.0000 mg | Freq: Once | INTRAMUSCULAR | Status: AC
  Administered 2017-08-30: 4 mg via INTRAVENOUS

## 2017-08-30 MED ORDER — ONDANSETRON HCL 4 MG/2ML IJ SOLN
INTRAMUSCULAR | Status: AC
  Filled 2017-08-30: qty 2

## 2017-08-30 NOTE — ED Notes (Signed)
The pt want something to eat and drink

## 2017-08-30 NOTE — ED Notes (Signed)
Case manager at the bedside.  

## 2017-08-30 NOTE — ED Provider Notes (Addendum)
[facility] EMERGENCY DEPARTMENT Provider Note   CSN: [id number] Arrival date & time: 08/30/17  1759     History   Chief Complaint Chief Complaint  Patient presents with  . Abdominal Pain    HPI Omar Nelson is a 47 y.o. male.  The history is provided by the patient.  Abdominal Pain   This is a recurrent problem. The current episode started 1 to 2 hours ago. The problem occurs constantly. The problem has been gradually worsening. Associated with: has a known inguinal hernia on the right and came out 2 hours ago and won't go back in. The pain is located in the RLQ. The quality of the pain is shooting, sharp and throbbing. The pain is at a severity of 10/10. The pain is severe. Associated symptoms include nausea. Pertinent negatives include fever, belching, diarrhea, vomiting and dysuria. The symptoms are aggravated by certain positions and palpation. Nothing relieves the symptoms. Past medical history comments: multiple reductions in the ER over the past month for same.    Past Medical History:  Diagnosis Date  . Asthma   . Hernia, abdominal     Patient Active Problem List   Diagnosis Date Noted  . Psychosis (HCC) 02/05/2016  . Cannabis use disorder, severe, dependence (HCC) 02/05/2016  . 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 Acute renal failure (ARF) (HCC) 02/04/2016    History reviewed. No pertinent surgical history.     Home Medications    Prior to Admission medications   Medication Sig Start Date End Date Taking? Authorizing Provider  albuterol (PROVENTIL HFA;VENTOLIN HFA) 108 (90 Base) MCG/ACT inhaler Inhale 1-2 puffs into the lungs every 6 (six) hours as needed for wheezing or shortness of breath. Patient not taking: Reported on 02/09/2017 10/05/15   Omar Born, PA-C  docusate sodium (COLACE) 50 MG capsule Take 1 capsule (50 mg total) by mouth 2 (two) times daily. Patient not taking: Reported on  08/11/2017 02/09/17   Omar Halim, PA-C  fluticasone [facility]) 50 MCG/ACT nasal spray Place 1 spray into both nostrils daily. 08/11/17   Omar Horseman, PA-C  ibuprofen (ADVIL,MOTRIN) 800 MG tablet Take 1 tablet (800 mg total) by mouth 3 (three) times daily. 08/13/17   Omar Hatchet, PA-C  OLANZapine zydis (ZYPREXA) 5 MG disintegrating tablet Take 1 tablet (5 mg total) by mouth 2 (two) times daily after a meal. Patient not taking: Reported on 02/09/2017 02/07/16   Omar Chapel, MD    Family History No family history on file.  Social History Social History   Tobacco Use  . Smoking status: Current Every Day Smoker  Substance Use Topics  . Alcohol use: No  . Drug use: No     Allergies   Patient has no known allergies.   Review of Systems Review of Systems  Constitutional: Negative for fever.  Gastrointestinal: Positive for abdominal pain and nausea. Negative for diarrhea and vomiting.  Genitourinary: Negative for dysuria.  All other systems reviewed and are negative.    Physical Exam Updated Vital Signs BP (!) 146/89   Pulse 68   Temp 98 F (36.7 C)   Resp 18   Wt 86.2 kg (190 lb)   SpO2 100%   BMI 24.39 kg/m   Physical Exam  Constitutional: He is oriented to person, place, and time. He appears well-developed and well-nourished. He appears distressed.  Pt is yelling and uncomfortable.  Appears to be in pain  HENT:  Head: Normocephalic and  atraumatic.  Mouth/Throat: Oropharynx is clear and moist.  Eyes: Conjunctivae and EOM are normal. Pupils are equal, round, and reactive to light.  Neck: Normal range of motion. Neck supple.  Cardiovascular: Normal rate, regular rhythm and intact distal pulses.  No murmur heard. Pulmonary/Chest: Effort normal and breath sounds normal. No respiratory distress. He has no wheezes. He has no rales.  Abdominal: Soft. He exhibits no distension. There is no tenderness. There is no rebound and no guarding. A hernia is present.  Hernia confirmed positive in the right inguinal area.  Firm, tender right inguinal hernia  Musculoskeletal: Normal range of motion. He exhibits no edema or tenderness.  Neurological: He is alert and oriented to person, place, and time.  Skin: Skin is warm and dry. No rash noted. No erythema.  Psychiatric: He has a normal mood and affect. His behavior is normal.  Nursing note and vitals reviewed.    ED Treatments / Results  Labs (all labs ordered are listed, but only abnormal results are displayed) Labs Reviewed - No data to display  EKG  EKG Interpretation None       Radiology No results found.  Procedures .Sedation Date/Time: 08/30/2017 11:11 PM Performed by: Gwyneth Sprout, MD Authorized by: Gwyneth Sprout, MD   Consent:    Consent obtained:  Verbal   Consent given by:  Patient   Risks discussed:  Allergic reaction, dysrhythmia, inadequate sedation, nausea, prolonged hypoxia resulting in organ damage, prolonged sedation necessitating reversal, respiratory compromise necessitating ventilatory assistance and intubation and vomiting   Alternatives discussed:  Analgesia without sedation, anxiolysis and regional anesthesia Universal protocol:    Procedure explained and questions answered to patient or proxy's satisfaction: yes     Relevant documents present and verified: yes     Required blood products, implants, devices, and special equipment available: yes     Site/side marked: yes     Immediately prior to procedure a time out was called: yes     Patient identity confirmation method:  Verbally with patient Indications:    Sedation is required to allow for: inguinal hernia reduction.   Procedure necessitating sedation performed by:  Physician performing sedation   Intended level of sedation:  Deep Pre-sedation assessment:    Time since last food or drink:  3pm   ASA classification: class 1 - normal, healthy patient     Neck mobility: normal     Mouth opening:  3  or more finger widths   Thyromental distance:  4 finger widths   Mallampati score:  I - soft palate, uvula, fauces, pillars visible   Pre-sedation assessments completed and reviewed: airway patency, cardiovascular function, hydration status, mental status, nausea/vomiting, pain level, respiratory function and temperature     Pre-sedation assessment completed:  08/30/2017 8:12 PM Immediate pre-procedure details:    Reassessment: Patient reassessed immediately prior to procedure     Reviewed: vital signs, relevant labs/tests and NPO status     Verified: bag valve mask available, emergency equipment available, intubation equipment available, IV patency confirmed, oxygen available and suction available   Procedure details (see MAR for exact dosages):    Preoxygenation:  Nasal cannula   Sedation:  Propofol   Intra-procedure monitoring:  Blood pressure monitoring, cardiac monitor, continuous pulse oximetry, frequent LOC assessments, frequent vital sign checks and continuous capnometry   Intra-procedure events: none     Total Provider sedation time (minutes):  15 Post-procedure details:    Post-sedation assessment completed:  08/30/2017 8:45 PM   Attendance: Jolayne Panther  attendance by certified staff until patient recovered     Recovery: Patient returned to pre-procedure baseline     Post-sedation assessments completed and reviewed: airway patency, cardiovascular function, hydration status, mental status, nausea/vomiting, pain level, respiratory function and temperature     Patient is stable for discharge or admission: yes     Patient tolerance:  Tolerated well, no immediate complications Hernia reduction Date/Time: 08/30/2017 11:13 PM Performed by: Gwyneth SproutPlunkett, Teairra Millar, MD Authorized by: Gwyneth SproutPlunkett, Cambree Hendrix, MD  Consent: Verbal consent obtained. Consent given by: patient Patient understanding: patient states understanding of the procedure being performed Patient consent: the patient's understanding of the  procedure matches consent given Relevant documents: relevant documents present and verified Site marked: the operative site was marked Patient identity confirmed: verbally with patient Time out: Immediately prior to procedure a "time out" was called to verify the correct patient, procedure, equipment, support staff and site/side marked as required.  Sedation: Patient sedated: yes Sedation type: moderate (conscious) sedation Sedatives: propofol Vitals: Vital signs were monitored during sedation.  Patient tolerance: Patient tolerated the procedure well with no immediate complications Comments: With significant force and difficulty right inguinal hernia reduced with constant pressure with full reduction and resolution of symptoms.    (including critical care time)  Medications Ordered in ED Medications  ondansetron (ZOFRAN) 4 MG/2ML injection (not administered)  HYDROmorphone (DILAUDID) injection 1 mg (1 mg Intravenous Given 08/30/17 1843)  ondansetron (ZOFRAN) injection 4 mg (4 mg Intravenous Given 08/30/17 1843)  HYDROmorphone (DILAUDID) injection 1 mg (1 mg Intravenous Given 08/30/17 1851)     Initial Impression / Assessment and Plan / ED Course  I have reviewed the triage vital signs and the nursing notes.  Pertinent labs & imaging results that were available during my care of the patient were reviewed by me and considered in my medical decision making (see chart for details).     Patient is returning with recurrent right incarcerated inguinal hernia.  Significant pain and tenderness over the site.  Patient was given Dilaudid and will attempt to reduce.  However right after pain medication patient was screaming and grabbing my hand and stating I had to stop.  He states he needs a little time to relax.  Will go back and try again.  7:29 PM Patient is still having too much significant pain and will not let me try to reduce it other than pushing for more than 5 seconds.  Will discuss  with general surgery.  Patient may need conscious sedation for reduction.  11:10 PM Hernia reduced after conscious sedation.  Had case management speak with the patient about being able to get an appointment with general surgery for repair in the future.  Will discharge patient home.  11:53 PM Prior to d/c pt states he has been having a productive cough for about 1 week and seems pt may have bronchitis.  Will treat with azithro.  Final Clinical Impressions(s) / ED Diagnoses   Final diagnoses:  Incarcerated inguinal hernia    ED Discharge Orders        Ordered    azithromycin (ZITHROMAX) 250 MG tablet  Daily     08/30/17 2353       Gwyneth SproutPlunkett, Shell Yandow, MD 08/30/17 [id number]    Gwyneth SproutPlunkett, Salima Rumer, MD 08/30/17 2354

## 2017-08-30 NOTE — ED Notes (Signed)
P;t has pulled off his 02 and his pulse ox  resp 18 arrouses  When name called  Not pleasant

## 2017-08-30 NOTE — ED Notes (Signed)
Pt pulling bpo cuff off will not stay still unco-operative

## 2017-08-30 NOTE — ED Notes (Signed)
The pt has no complaints 

## 2017-08-30 NOTE — ED Notes (Signed)
Pt unco-operative will not keep 02 on keeps pulling at the bp cuff

## 2017-08-30 NOTE — ED Notes (Signed)
Pt refuses to sign his dc papers states we didn't do nothing for him, EDP provider notified and talking to him.

## 2017-08-30 NOTE — ED Triage Notes (Signed)
Pt presents to the ed with complaints of abdominal pain to his left groin with EMS. Pt arrives yelling at staff stating that he is having severe pain and cannot sit down. Security and charge nurse at the bedside with patient attempting to keep the patient calm.

## 2017-08-30 NOTE — ED Notes (Signed)
Pt uncoopreative will not keep 02 in his nose

## 2017-09-22 ENCOUNTER — Other Ambulatory Visit: Payer: Self-pay

## 2017-09-22 ENCOUNTER — Encounter (HOSPITAL_COMMUNITY): Payer: Self-pay | Admitting: Emergency Medicine

## 2017-09-22 ENCOUNTER — Emergency Department (HOSPITAL_COMMUNITY)
Admission: EM | Admit: 2017-09-22 | Discharge: 2017-09-22 | Payer: Self-pay | Attending: Emergency Medicine | Admitting: Emergency Medicine

## 2017-09-22 DIAGNOSIS — R4689 Other symptoms and signs involving appearance and behavior: Secondary | ICD-10-CM | POA: Insufficient documentation

## 2017-09-22 DIAGNOSIS — J45909 Unspecified asthma, uncomplicated: Secondary | ICD-10-CM | POA: Insufficient documentation

## 2017-09-22 DIAGNOSIS — F17210 Nicotine dependence, cigarettes, uncomplicated: Secondary | ICD-10-CM | POA: Insufficient documentation

## 2017-09-22 DIAGNOSIS — R1084 Generalized abdominal pain: Secondary | ICD-10-CM | POA: Insufficient documentation

## 2017-09-22 NOTE — ED Triage Notes (Signed)
Pt BIB [facility]. Patient complaining of hernia pain, seen multiple times for same and refuses to follow up with surgery. Patient drinking liquor on scene. Drank 3/4 of the bottle. Patient refused all assessments, would not let anybody touch him. Patient loud and belligerent with [employer] on scene. Unable to assess or speak with patient. Patient is screaming and cursing, telling this Clinical research associatewriter, "Fuck you, don't ask me questions. You know my name."

## 2017-09-22 NOTE — ED Provider Notes (Signed)
[facility] Note   CSN: [id number] Arrival date & time: 09/22/17  0035     History   Chief Complaint Chief Complaint  Patient presents with  . hernia pain    HPI Omar Nelson is a 47 y.o. male.  47 year old male with history of psychosis as well as substance abuse who presents combative and belligerent and threatening personnel.  Patient is redirectable and appears to be intoxicated at this time.  When I spoke with patient, he threatened me but seems to understand his current situation.  He refuses to cooperate at this time.      Past Medical History:  Diagnosis Date  . Asthma   . Hernia, abdominal     Patient Active Problem List   Diagnosis Date Noted  . Psychosis (HCC) 02/05/2016  . Cannabis use disorder, severe, dependence (HCC) 02/05/2016  . 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 Acute renal failure (ARF) (HCC) 02/04/2016    No past surgical history on file.     Home Medications    Prior to Admission medications   Medication Sig Start Date End Date Taking? Authorizing Provider  albuterol (PROVENTIL HFA;VENTOLIN HFA) 108 (90 Base) MCG/ACT inhaler Inhale 1-2 puffs into the lungs every 6 (six) hours as needed for wheezing or shortness of breath. Patient not taking: Reported on 02/09/2017 10/05/15   Bethel BornGekas, Kelly Marie, PA-C  azithromycin (ZITHROMAX) 250 MG tablet Take 1 tablet (250 mg total) by mouth daily. Take 1 every day until finished. 08/30/17   Gwyneth SproutPlunkett, Whitney, MD  docusate sodium (COLACE) 50 MG capsule Take 1 capsule (50 mg total) by mouth 2 (two) times daily. Patient not taking: Reported on 08/11/2017 02/09/17   Jacinto HalimMaczis, Michael M, PA-C  fluticasone [facility](FLONASE) 50 MCG/ACT nasal spray Place 1 spray into both nostrils daily. Patient not taking: Reported on 08/30/2017 08/11/17   Roxy HorsemanBrowning, Robert, PA-C  ibuprofen (ADVIL,MOTRIN) 800 MG tablet Take 1 tablet (800 mg total) by mouth 3 (three)  times daily. Patient not taking: Reported on 08/30/2017 08/13/17   Garlon HatchetSanders, Lisa M, PA-C  OLANZapine zydis (ZYPREXA) 5 MG disintegrating tablet Take 1 tablet (5 mg total) by mouth 2 (two) times daily after a meal. Patient not taking: Reported on 02/09/2017 02/07/16   Barnetta Chapelgbata, Sylvester I, MD    Family History No family history on file.  Social History Social History   Tobacco Use  . Smoking status: Current Every Day Smoker  Substance Use Topics  . Alcohol use: No  . Drug use: No     Allergies   Patient has no known allergies.   Review of Systems Review of Systems  Unable to perform ROS: Acuity of condition     Physical Exam Updated Vital Signs There were no vitals taken for this visit.  Physical Exam  Constitutional: He is oriented to person, place, and time. He appears well-developed and well-nourished.  Non-toxic appearance. No distress.  HENT:  Head: Normocephalic and atraumatic.  Eyes: Conjunctivae, EOM and lids are normal. Pupils are equal, round, and reactive to light.  Neck: Normal range of motion. Neck supple. No tracheal deviation present. No thyroid mass present.  Cardiovascular: Normal rate, regular rhythm and normal heart sounds. Exam reveals no gallop.  No murmur heard. Pulmonary/Chest: Effort normal and breath sounds normal. No stridor. No respiratory distress. He has no decreased breath sounds. He has no wheezes. He has no rhonchi. He has no rales.  Abdominal: Soft. Normal appearance and bowel sounds are normal.  He exhibits no distension. There is no tenderness. There is no rebound and no CVA tenderness.  Musculoskeletal: Normal range of motion. He exhibits no edema or tenderness.  Neurological: He is alert and oriented to person, place, and time. No cranial nerve deficit or sensory deficit. GCS eye subscore is 4. GCS verbal subscore is 5. GCS motor subscore is 6.  Skin: Skin is warm and dry. No abrasion and no rash noted.  Psychiatric: His mood appears  anxious. His speech is slurred. He is agitated and aggressive.  Nursing note and vitals reviewed.    ED Treatments / Results  Labs (all labs ordered are listed, but only abnormal results are displayed) Labs Reviewed - No data to display  EKG  EKG Interpretation None       Radiology No results found.  Procedures Procedures (including critical care time)  Medications Ordered in ED Medications - No data to display   Initial Impression / Assessment and Plan / ED Course  I have reviewed the triage vital signs and the nursing notes.  Pertinent labs & imaging results that were available during my care of the patient were reviewed by me and considered in my medical decision making (see chart for details).     Patient appears to be in acute endoscopy at this time.  Is threatening and verbally abusive.  Does not appear to be having an acute psychosis.  He is oriented x4.  Will be taken to police custody  Final Clinical Impressions(s) / ED Diagnoses   Final diagnoses:  Aggressive behavior    ED Discharge Orders    None       Lorre Nick, MD 09/22/17 0040

## 2017-09-22 NOTE — ED Notes (Signed)
Patient belligerent and refusing care.

## 2017-09-22 NOTE — ED Notes (Signed)
Bed: WA21 Expected date:  Expected time:  Means of arrival:  Comments: Combative-EMS

## 2017-10-03 ENCOUNTER — Emergency Department (HOSPITAL_COMMUNITY)
Admission: EM | Admit: 2017-10-03 | Discharge: 2017-10-03 | Disposition: A | Discharge status: Home / Self Care | Payer: Self-pay | Attending: Emergency Medicine | Admitting: Emergency Medicine

## 2017-10-03 ENCOUNTER — Encounter (HOSPITAL_COMMUNITY): Payer: Self-pay | Admitting: Emergency Medicine

## 2017-10-03 DIAGNOSIS — J45909 Unspecified asthma, uncomplicated: Secondary | ICD-10-CM | POA: Insufficient documentation

## 2017-10-03 DIAGNOSIS — F17210 Nicotine dependence, cigarettes, uncomplicated: Secondary | ICD-10-CM | POA: Insufficient documentation

## 2017-10-03 DIAGNOSIS — K4091 Unilateral inguinal hernia, without obstruction or gangrene, recurrent: Secondary | ICD-10-CM | POA: Insufficient documentation

## 2017-10-03 DIAGNOSIS — K4090 Unilateral inguinal hernia, without obstruction or gangrene, not specified as recurrent: Secondary | ICD-10-CM

## 2017-10-03 NOTE — Discharge Instructions (Addendum)
You may alternate Tylenol 1000 mg every 6 hours as needed for pain and Ibuprofen 800 mg every 8 hours as needed for pain.  Please take Ibuprofen with food. ° °

## 2017-10-03 NOTE — ED Triage Notes (Signed)
BIB [facility] from home, pt reports R groin pain r/t inguinal hernia. States pain has been present X3 years, has been seen here multiple times for same, has not followed up with surgery as instructed to. Pt has beer with him, was thrown away per Security.

## 2017-10-03 NOTE — ED Triage Notes (Signed)
See downtime notes.

## 2017-10-03 NOTE — ED Provider Notes (Signed)
TIME SEEN: 5:22 AM  CHIEF COMPLAINT: Hernia  HPI: Patient is a 47 year old male with history of asthma and right inguinal hernia who presents emergency department complaining of his right inguinal hernia.  States that he was not able to reduce it earlier but was able to reduce it in the waiting room.  Now has no complaints.  No fevers, vomiting, diarrhea.  Having normal bowel movements.  States he does not have insurance and that is why he has not followed up with general surgery.  ROS: See HPI Constitutional: no fever  Eyes: no drainage  ENT: no runny nose   Cardiovascular:  no chest pain  Resp: no SOB  GI: no vomiting GU: no dysuria Integumentary: no rash  Allergy: no hives  Musculoskeletal: no leg swelling  Neurological: no slurred speech ROS otherwise negative  PAST MEDICAL HISTORY/PAST SURGICAL HISTORY:  Past Medical History:  Diagnosis Date  . Asthma   . Hernia, abdominal     MEDICATIONS:  Prior to Admission medications   Medication Sig Start Date End Date Taking? Authorizing Provider  albuterol (PROVENTIL HFA;VENTOLIN HFA) 108 (90 Base) MCG/ACT inhaler Inhale 1-2 puffs into the lungs every 6 (six) hours as needed for wheezing or shortness of breath. Patient not taking: Reported on 02/09/2017 10/05/15   Bethel BornGekas, Kelly Marie, PA-C  azithromycin (ZITHROMAX) 250 MG tablet Take 1 tablet (250 mg total) by mouth daily. Take 1 every day until finished. 08/30/17   Gwyneth SproutPlunkett, Whitney, MD  docusate sodium (COLACE) 50 MG capsule Take 1 capsule (50 mg total) by mouth 2 (two) times daily. Patient not taking: Reported on 08/11/2017 02/09/17   Jacinto HalimMaczis, Michael M, PA-C  fluticasone [facility](FLONASE) 50 MCG/ACT nasal spray Place 1 spray into both nostrils daily. Patient not taking: Reported on 08/30/2017 08/11/17   Roxy HorsemanBrowning, Robert, PA-C  ibuprofen (ADVIL,MOTRIN) 800 MG tablet Take 1 tablet (800 mg total) by mouth 3 (three) times daily. Patient not taking: Reported on 08/30/2017 08/13/17   Garlon HatchetSanders, Lisa M,  PA-C  OLANZapine zydis (ZYPREXA) 5 MG disintegrating tablet Take 1 tablet (5 mg total) by mouth 2 (two) times daily after a meal. Patient not taking: Reported on 02/09/2017 02/07/16   Barnetta Chapelgbata, Sylvester I, MD    ALLERGIES:  No Known Allergies  SOCIAL HISTORY:  Social History   Tobacco Use  . Smoking status: Current Every Day Smoker  Substance Use Topics  . Alcohol use: No    FAMILY HISTORY: No family history on file.  EXAM: BP (!) 112/57   Pulse 61   Temp 97.7 F (36.5 C)   Resp 17   SpO2 99%  CONSTITUTIONAL: Alert and oriented and responds appropriately to questions. Well-appearing; well-nourished HEAD: Normocephalic EYES: Conjunctivae clear, pupils appear equal, EOMI ENT: normal nose; moist mucous membranes NECK: Supple, no meningismus, no nuchal rigidity, no LAD  CARD: RRR; S1 and S2 appreciated; no murmurs, no clicks, no rubs, no gallops RESP: Normal chest excursion without splinting or tachypnea; breath sounds clear and equal bilaterally; no wheezes, no rhonchi, no rales, no hypoxia or respiratory distress, speaking full sentences ABD/GI: Normal bowel sounds; non-distended; soft, non-tender, no rebound, no guarding, no peritoneal signs, no hepatosplenomegaly, no hernia appreciated, no skin changes, 2+ pulses bilaterally, no scrotal masses or swelling BACK:  The back appears normal and is non-tender to palpation, there is no CVA tenderness EXT: Normal ROM in all joints; non-tender to palpation; no edema; normal capillary refill; no cyanosis, no calf tenderness or swelling    SKIN: Normal color for  age and race; warm; no rash NEURO: Moves all extremities equally PSYCH: The patient's mood and manner are appropriate. Grooming and personal hygiene are appropriate.  MEDICAL DECISION MAKING: Patient here with right inguinal hernia that has reduced.  Abdominal exam is benign.  No other complaints.  Hemodynamically stable.  I feel he is safe to be discharged with outpatient  follow-up.  Recommend alternating Tylenol and Motrin for pain.  Nothing at this time to suggest incarcerated, strangulated hernia.  At this time, I do not feel there is any life-threatening condition present. I have reviewed and discussed all results (EKG, imaging, lab, urine as appropriate) and exam findings with patient/family. I have reviewed nursing notes and appropriate previous records.  I feel the patient is safe to be discharged home without further emergent workup and can continue workup as an outpatient as needed. Discussed usual and customary return precautions. Patient/family verbalize understanding and are comfortable with this plan.  Outpatient follow-up has been provided if needed. All questions have been answered.     Ward, Layla Maw, DO 10/03/17 [phone]

## 2017-10-21 ENCOUNTER — Encounter (HOSPITAL_COMMUNITY): Payer: Self-pay

## 2017-10-21 ENCOUNTER — Emergency Department (HOSPITAL_COMMUNITY)
Admission: EM | Admit: 2017-10-21 | Discharge: 2017-10-21 | Disposition: A | Discharge status: Home / Self Care | Payer: Self-pay | Attending: Emergency Medicine | Admitting: Emergency Medicine

## 2017-10-21 ENCOUNTER — Other Ambulatory Visit: Payer: Self-pay

## 2017-10-21 DIAGNOSIS — K4090 Unilateral inguinal hernia, without obstruction or gangrene, not specified as recurrent: Secondary | ICD-10-CM | POA: Insufficient documentation

## 2017-10-21 DIAGNOSIS — J45909 Unspecified asthma, uncomplicated: Secondary | ICD-10-CM | POA: Insufficient documentation

## 2017-10-21 DIAGNOSIS — F17200 Nicotine dependence, unspecified, uncomplicated: Secondary | ICD-10-CM | POA: Insufficient documentation

## 2017-10-21 MED ORDER — NAPROXEN 500 MG PO TABS
500.0000 mg | ORAL_TABLET | Freq: Once | ORAL | Status: AC
  Administered 2017-10-21: 500 mg via ORAL
  Filled 2017-10-21: qty 1

## 2017-10-21 MED ORDER — IBUPROFEN 600 MG PO TABS: 600.0000 mg | ORAL_TABLET | Freq: Three times a day (TID) | ORAL | 0 refills | Status: AC | PRN

## 2017-10-21 NOTE — ED Provider Notes (Signed)
[facility]-EMERGENCY DEPT Provider Note   CSN: [id number] Arrival date & time: 10/21/17  4098     History   Chief Complaint Chief Complaint  Patient presents with  . Abdominal Pain    HPI Omar Nelson is a 47 y.o. male.  HPI Patient has a history of a right-sided inguinal hernia.  Patient has had this hernia for at least a year.  He has been seen several times in the past and has been referred to general surgery unfortunately patient is not able to follow-up with them because of financial issues.  Patient had an episode this morning where he was having pain in the right inguinal region when the hernia popped out.  He was unable to reduce the hernia and came to the emergency room.  While waiting in the waiting room, the hernia has reduced and his pain has now resolved.  Patient denies any fevers, vomiting or diarrhea.  No chest pain or shortness of breath.  He denies any other complaints. Past Medical History:  Diagnosis Date  . Asthma   . Hernia, abdominal     Patient Active Problem List   Diagnosis Date Noted  . Psychosis (HCC) 02/05/2016  . Cannabis use disorder, severe, dependence (HCC) 02/05/2016  . 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 Acute renal failure (ARF) (HCC) 02/04/2016    History reviewed. No pertinent surgical history.      Home Medications    Prior to Admission medications   Medication Sig Start Date End Date Taking? Authorizing Provider  albuterol (PROVENTIL HFA;VENTOLIN HFA) 108 (90 Base) MCG/ACT inhaler Inhale 1-2 puffs into the lungs every 6 (six) hours as needed for wheezing or shortness of breath. Patient not taking: Reported on 02/09/2017 10/05/15   Bethel Born, PA-C  azithromycin (ZITHROMAX) 250 MG tablet Take 1 tablet (250 mg total) by mouth daily. Take 1 every day until finished. Patient not taking: Reported on 10/21/2017 08/30/17   Gwyneth Sprout, MD  docusate sodium (COLACE) 50 MG  capsule Take 1 capsule (50 mg total) by mouth 2 (two) times daily. Patient not taking: Reported on 08/11/2017 02/09/17   Jacinto Halim, PA-C  fluticasone [facility]) 50 MCG/ACT nasal spray Place 1 spray into both nostrils daily. Patient not taking: Reported on 08/30/2017 08/11/17   Roxy Horseman, PA-C  ibuprofen (ADVIL,MOTRIN) 600 MG tablet Take 1 tablet (600 mg total) by mouth every 8 (eight) hours as needed. 10/21/17   Linwood Dibbles, MD  OLANZapine zydis (ZYPREXA) 5 MG disintegrating tablet Take 1 tablet (5 mg total) by mouth 2 (two) times daily after a meal. Patient not taking: Reported on 02/09/2017 02/07/16   Barnetta Chapel, MD    Family History History reviewed. No pertinent family history.  Social History Social History   Tobacco Use  . Smoking status: Current Every Day Smoker  . Smokeless tobacco: Never Used  Substance Use Topics  . Alcohol use: No  . Drug use: No     Allergies   Patient has no known allergies.   Review of Systems Review of Systems  All other systems reviewed and are negative.    Physical Exam Updated Vital Signs BP 129/85 (BP Location: Right Arm)   Pulse 82   Resp 18   Ht 1.803 m (5\' 11" )   Wt 86.2 kg (190 lb)   SpO2 99%   BMI 26.50 kg/m   Physical Exam  Constitutional: He appears well-developed and well-nourished. No distress.  HENT:  Head:  Normocephalic and atraumatic.  Right Ear: External ear normal.  Left Ear: External ear normal.  Eyes: Conjunctivae are normal. Right eye exhibits no discharge. Left eye exhibits no discharge. No scleral icterus.  Neck: Neck supple. No tracheal deviation present.  Cardiovascular: Normal rate, regular rhythm and intact distal pulses.  Pulmonary/Chest: Effort normal and breath sounds normal. No stridor. No respiratory distress. He has no wheezes. He has no rales.  Abdominal: Soft. Bowel sounds are normal. He exhibits no distension. There is no tenderness. There is no rebound and no guarding.  Mild  tenderness in the right inguinal region at the location of his inguinal hernia but while the patient is supine the hernia is reduced, no evidence of edema, incarceration or strangulation  Musculoskeletal: He exhibits no edema or tenderness.  Neurological: He is alert. He has normal strength. No cranial nerve deficit (no facial droop, extraocular movements intact, no slurred speech) or sensory deficit. He exhibits normal muscle tone. He displays no seizure activity. Coordination normal.  Skin: Skin is warm and dry. No rash noted.  Psychiatric: He has a normal mood and affect.  Nursing note and vitals reviewed.    ED Treatments / Results  Procedures Procedures (including critical care time)  Medications Ordered in ED Medications  naproxen (NAPROSYN) tablet 500 mg (has no administration in time range)     Initial Impression / Assessment and Plan / ED Course  I have reviewed the triage vital signs and the nursing notes.  Pertinent labs & imaging results that were available during my care of the patient were reviewed by me and considered in my medical decision making (see chart for details).   Patient has a known right inguinal hernia.  He was having trouble with his hernia earlier today.  The hernia has subsequently reduced prior to my evaluation.  Patient understands that definitive treatment for surgery.  I discussed the use of a trust to help him with his discomfort in the meantime.  At this time there does not appear to be any evidence of an acute emergency medical condition and the patient appears stable for discharge with appropriate outpatient follow up.   Final Clinical Impressions(s) / ED Diagnoses   Final diagnoses:  Unilateral inguinal hernia without obstruction or gangrene, recurrence not specified    ED Discharge Orders        Ordered    ibuprofen (ADVIL,MOTRIN) 600 MG tablet  Every 8 hours PRN     10/21/17 1037       Linwood DibblesKnapp, Verdelle Valtierra, MD 10/21/17 1037

## 2017-10-21 NOTE — ED Notes (Signed)
I woke patient up to give him his pain meds and d/c paperwork. I asked him what he'd like to drink and he said he didn't care. I brought him a water. He said I don't want that. I said ok I'm happy to give you whatever you'd want. He said a pop is fine, I said ok I'll be right back. He said "It's too fucking cold in here and you run around without a jacket on? He refused to take BP and then asked for a sandwich. I said I don't think there are any left (which there wasn't), and he asked to see the doctor because he had pain and then said for me to stay the fuck away from him. I got Dr. Lynelle DoctorKnapp and he asked for a sandwich again and I said that he's been d/c so we don't give sandwiches usually if there even were any. Dr. Lynelle DoctorKnapp got him crackers and the patient continued to curse and argue with me. Security was called to bedside to escort out.

## 2017-10-21 NOTE — ED Notes (Signed)
Patient awakened and asked to go to the Lobby. Security on standby. Patient walked peacefully to the lobby.

## 2017-10-21 NOTE — Discharge Instructions (Addendum)
Try using the truss as we discussed.  Follow-up with surgery when you are able.

## 2017-10-21 NOTE — ED Triage Notes (Signed)
States for one hour abdominal pain no n/v voiced no fever was in by [facility].

## 2017-10-21 NOTE — ED Notes (Signed)
Pt lying in floor refuses to lie on stretcher and has eyes closed appears to be resting.

## 2017-12-14 ENCOUNTER — Encounter (HOSPITAL_COMMUNITY): Payer: Self-pay

## 2017-12-14 ENCOUNTER — Other Ambulatory Visit: Payer: Self-pay

## 2017-12-14 ENCOUNTER — Emergency Department (HOSPITAL_COMMUNITY)
Admission: EM | Admit: 2017-12-14 | Discharge: 2017-12-14 | Disposition: A | Discharge status: Home / Self Care | Payer: Self-pay | Attending: Emergency Medicine | Admitting: Emergency Medicine

## 2017-12-14 DIAGNOSIS — J45909 Unspecified asthma, uncomplicated: Secondary | ICD-10-CM | POA: Insufficient documentation

## 2017-12-14 DIAGNOSIS — J020 Streptococcal pharyngitis: Secondary | ICD-10-CM | POA: Insufficient documentation

## 2017-12-14 DIAGNOSIS — R05 Cough: Secondary | ICD-10-CM

## 2017-12-14 DIAGNOSIS — F17200 Nicotine dependence, unspecified, uncomplicated: Secondary | ICD-10-CM | POA: Insufficient documentation

## 2017-12-14 DIAGNOSIS — R059 Cough, unspecified: Secondary | ICD-10-CM

## 2017-12-14 LAB — GROUP A STREP BY PCR: GROUP A STREP BY PCR: DETECTED — AB

## 2017-12-14 MED ORDER — DEXAMETHASONE SODIUM PHOSPHATE 10 MG/ML IJ SOLN
10.0000 mg | Freq: Once | INTRAMUSCULAR | Status: AC
  Administered 2017-12-14: 10 mg via INTRAMUSCULAR
  Filled 2017-12-14: qty 1

## 2017-12-14 MED ORDER — PENICILLIN G BENZATHINE & PROC 1200000 UNIT/2ML IM SUSP
1.2000 10*6.[IU] | Freq: Once | INTRAMUSCULAR | Status: AC
  Administered 2017-12-14: 1.2 10*6.[IU] via INTRAMUSCULAR
  Filled 2017-12-14 (×2): qty 2

## 2017-12-14 MED ORDER — ALBUTEROL SULFATE HFA 108 (90 BASE) MCG/ACT IN AERS
2.0000 | INHALATION_SPRAY | Freq: Once | RESPIRATORY_TRACT | Status: AC
  Administered 2017-12-14: 2 via RESPIRATORY_TRACT
  Filled 2017-12-14: qty 6.7

## 2017-12-14 NOTE — ED Notes (Signed)
Pt verbalized understanding discharge instructions and denies any further needs or questions at this time. VS stable, ambulatory and steady gait.   

## 2017-12-14 NOTE — ED Triage Notes (Signed)
Pt states that he thinks he has strept throat "ammonia, or pneumonia, I don't know look at me" and "my hernia but I aint here for that today".

## 2017-12-14 NOTE — Discharge Instructions (Addendum)
Your strep screen is positive.  You were treated today for strep infection.  Drink plenty of fluids, rest, Tylenol or Motrin for pain.  Follow-up with a primary care doctor.  You  can try [facility] for follow-up.  Return if worsening symptoms

## 2017-12-14 NOTE — ED Provider Notes (Signed)
[facility] Provider Note   CSN: [id number] Arrival date & time: 12/14/17  1527     History   Chief Complaint Chief Complaint  Patient presents with  . Sore Throat    HPI Omar Nelson is a 47 y.o. male.  HPI Omar Nelson is a 47 y.o. male presents to emergency department with complaint of sore throat and cough.  Patient states that he has had symptoms for a week.  He reports pain with swallowing.  Reports voice hoarseness.  Reports productive cough.  He also has chronic hernia which she states he cannot get fixed because he does not have insurance or primary care doctor.  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  He denies any fever or chills.  No nausea or vomiting.  He states his girlfriend is sick with similar symptoms.  Past Medical History:  Diagnosis Date  . Asthma   . Hernia, abdominal     Patient Active Problem List   Diagnosis Date Noted  . Psychosis (HCC) 02/05/2016  . Cannabis use disorder, severe, dependence (HCC) 02/05/2016  . 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 Acute renal failure (ARF) (HCC) 02/04/2016    History reviewed. No pertinent surgical history.      Home Medications    Prior to Admission medications   Medication Sig Start Date End Date Taking? Authorizing Provider  albuterol (PROVENTIL HFA;VENTOLIN HFA) 108 (90 Base) MCG/ACT inhaler Inhale 1-2 puffs into the lungs every 6 (six) hours as needed for wheezing or shortness of breath. Patient not taking: Reported on 02/09/2017 10/05/15   Bethel BornGekas, Kelly Marie, PA-C  azithromycin (ZITHROMAX) 250 MG tablet Take 1 tablet (250 mg total) by mouth daily. Take 1 every day until finished. Patient not taking: Reported on 10/21/2017 08/30/17   Gwyneth SproutPlunkett, Whitney, MD  docusate sodium (COLACE) 50 MG capsule Take 1 capsule (50 mg total) by mouth 2 (two) times daily. Patient not taking: Reported on 08/11/2017  02/09/17   Jacinto HalimMaczis, Michael M, PA-C  fluticasone [facility](FLONASE) 50 MCG/ACT nasal spray Place 1 spray into both nostrils daily. Patient not taking: Reported on 08/30/2017 08/11/17   Roxy HorsemanBrowning, Robert, PA-C  ibuprofen (ADVIL,MOTRIN) 600 MG tablet Take 1 tablet (600 mg total) by mouth every 8 (eight) hours as needed. 10/21/17   Linwood DibblesKnapp, Jon, MD  OLANZapine zydis (ZYPREXA) 5 MG disintegrating tablet Take 1 tablet (5 mg total) by mouth 2 (two) times daily after a meal. Patient not taking: Reported on 02/09/2017 02/07/16   Barnetta Chapelgbata, Sylvester I, MD    Family History No family history on file.  Social History Social History   Tobacco Use  . Smoking status: Current Every Day Smoker  . Smokeless tobacco: Never Used  Substance Use Topics  . Alcohol use: No  . Drug use: No     Allergies   Patient has no known allergies.   Review of Systems Review of Systems  Constitutional: Negative for chills and fever.  HENT: Positive for sore throat.   Respiratory: Positive for cough. Negative for chest tightness and shortness of breath.   Cardiovascular: Negative for chest pain, palpitations and leg swelling.  Gastrointestinal: Negative for abdominal distention, abdominal pain, diarrhea, nausea and vomiting.  Genitourinary: Negative for dysuria, frequency, hematuria and urgency.  Musculoskeletal: Negative for arthralgias, myalgias, neck pain and neck stiffness.  Skin: Negative for rash.  Allergic/Immunologic: Negative for immunocompromised state.  Neurological: Positive for headaches. Negative for dizziness, weakness,  light-headedness and numbness.  All other systems reviewed and are negative.    Physical Exam Updated Vital Signs BP (!) 153/108 (BP Location: Right Arm) Comment: pt moving while cuff inflating.   Pulse 65   Temp 98.4 F (36.9 C) (Oral)   Resp 16   Ht 5\' 11"  (1.803 m)   Wt 79.8 kg (176 lb)   SpO2 97%   BMI 24.55 kg/m   Physical Exam  Constitutional: He appears well-developed and  well-nourished. No distress.  HENT:  Head: Normocephalic and atraumatic.  Oropharynx is erythematous, with petechiae  Eyes: Conjunctivae are normal.  Neck: Neck supple.  Cardiovascular: Normal rate, regular rhythm and normal heart sounds.  Pulmonary/Chest: Effort normal. No respiratory distress. He has no wheezes. He has no rales.  Abdominal: Soft. Bowel sounds are normal. He exhibits no distension. There is no tenderness. There is no rebound.  Musculoskeletal: He exhibits no edema.  Neurological: He is alert.  Skin: Skin is warm and dry.  Nursing note and vitals reviewed.    ED Treatments / Results  Labs (all labs ordered are listed, but only abnormal results are displayed) Labs Reviewed  GROUP A STREP BY PCR - Abnormal; Notable for the following components:      Result Value   Group A Strep by PCR DETECTED (*)    All other components within normal limits    EKG None  Radiology No results found.  Procedures Procedures (including critical care time)  Medications Ordered in ED Medications  dexamethasone (DECADRON) injection 10 mg (has no administration in time range)  penicillin g procaine-penicillin g benzathine (BICILLIN-CR) injection 600000-600000 units (has no administration in time range)  albuterol (PROVENTIL HFA;VENTOLIN HFA) 108 (90 Base) MCG/ACT inhaler 2 puff (has no administration in time range)     Initial Impression / Assessment and Plan / ED Course  I have reviewed the triage vital signs and the nursing notes.  Pertinent labs & imaging results that were available during my care of the patient were reviewed by me and considered in my medical decision making (see chart for details).     Patient in emergency department with sore throat, cough, generalized malaise.  Her vital signs are normal other than mild hypertension.  He is afebrile.  Oropharynx erythematous with petechia, no exudate.  No peritonsillar abscess.  Lungs are clear but he is coughing.  He  is a smoker.  Rapid strep came back positive.  Patient states he has no money and will not be able to fill any prescriptions were given.  I will give him a shot of Bicillin IM, will give him a shot of Decadron, will provide with an inhaler.  Instructed to try over-the-counter medications if he is able to get some and otherwise follow-up with family doctor.  We will give him referral to [facility].  Stable for discharge home at this time.  Vitals:   12/14/17 1627 12/14/17 1628  BP: (!) 153/108   Pulse: 65   Resp: 16   Temp: 98.4 F (36.9 C)   TempSrc: Oral   SpO2: 97%   Weight:  79.8 kg (176 lb)  Height:  5\' 11"  (1.803 m)     Final Clinical Impressions(s) / ED Diagnoses   Final diagnoses:  Strep throat  Cough    ED Discharge Orders    None       Jaynie Crumble, PA-C 12/14/17 1909    Melene Plan, DO 12/15/17 0010

## 2017-12-20 ENCOUNTER — Emergency Department (HOSPITAL_COMMUNITY): Payer: Self-pay

## 2017-12-20 ENCOUNTER — Emergency Department (HOSPITAL_COMMUNITY)
Admission: EM | Admit: 2017-12-20 | Discharge: 2017-12-21 | Disposition: A | Discharge status: Home / Self Care | Payer: Self-pay | Attending: Emergency Medicine | Admitting: Emergency Medicine

## 2017-12-20 DIAGNOSIS — Y903 Blood alcohol level of 60-79 mg/100 ml: Secondary | ICD-10-CM | POA: Insufficient documentation

## 2017-12-20 DIAGNOSIS — M79605 Pain in left leg: Secondary | ICD-10-CM | POA: Insufficient documentation

## 2017-12-20 DIAGNOSIS — F17210 Nicotine dependence, cigarettes, uncomplicated: Secondary | ICD-10-CM | POA: Insufficient documentation

## 2017-12-20 DIAGNOSIS — F22 Delusional disorders: Secondary | ICD-10-CM | POA: Insufficient documentation

## 2017-12-20 DIAGNOSIS — F10920 Alcohol use, unspecified with intoxication, uncomplicated: Secondary | ICD-10-CM | POA: Insufficient documentation

## 2017-12-20 LAB — SALICYLATE LEVEL

## 2017-12-20 LAB — COMPREHENSIVE METABOLIC PANEL
ALBUMIN: 3.8 g/dL (ref 3.5–5.0)
ALK PHOS: 58 U/L (ref 38–126)
ALT: 27 U/L (ref 17–63)
AST: 44 U/L — ABNORMAL HIGH (ref 15–41)
Anion gap: 10 (ref 5–15)
BILIRUBIN TOTAL: 0.5 mg/dL (ref 0.3–1.2)
BUN: 20 mg/dL (ref 6–20)
CALCIUM: 9 mg/dL (ref 8.9–10.3)
CO2: 22 mmol/L (ref 22–32)
CREATININE: 0.92 mg/dL (ref 0.61–1.24)
Chloride: 106 mmol/L (ref 101–111)
GFR calc Af Amer: >60 mL/min (ref >60)
GFR calc non Af Amer: >60 mL/min (ref >60)
GLUCOSE: 96 mg/dL (ref 65–99)
Potassium: 3.7 mmol/L (ref 3.5–5.1)
Sodium: 138 mmol/L (ref 135–145)
TOTAL PROTEIN: 7.4 g/dL (ref 6.5–8.1)

## 2017-12-20 LAB — RAPID URINE DRUG SCREEN, HOSP PERFORMED
Amphetamines: NOT DETECTED
Barbiturates: NOT DETECTED
Benzodiazepines: POSITIVE — AB
Cocaine: POSITIVE — AB
OPIATES: NOT DETECTED
Tetrahydrocannabinol: POSITIVE — AB

## 2017-12-20 LAB — ETHANOL: Alcohol, Ethyl (B): 69 mg/dL — ABNORMAL HIGH (ref <10)

## 2017-12-20 LAB — CBC
HCT: 37.9 % — ABNORMAL LOW (ref 39.0–52.0)
Hemoglobin: 12.7 g/dL — ABNORMAL LOW (ref 13.0–17.0)
MCH: 32.2 pg (ref 26.0–34.0)
MCHC: 33.5 g/dL (ref 30.0–36.0)
MCV: 95.9 fL (ref 78.0–100.0)
PLATELETS: 318 10*3/uL (ref 150–400)
RBC: 3.95 MIL/uL — ABNORMAL LOW (ref 4.22–5.81)
RDW: 12.4 % (ref 11.5–15.5)
WBC: 4.4 10*3/uL (ref 4.0–10.5)

## 2017-12-20 LAB — ACETAMINOPHEN LEVEL
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
## 2017-12-20 NOTE — ED Notes (Signed)
UNABLE TO COMPLETE TRIAGE- PT IS SEDATED
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
## 2017-12-21 NOTE — ED Notes (Signed)
Attempted to d/c pt  Pt became angry with me. Pt verbalized I was "racist" and told me to leave. Charge notified

## 2017-12-21 NOTE — Discharge Instructions (Signed)
Be careful on the road!

## 2018-03-24 ENCOUNTER — Encounter (HOSPITAL_COMMUNITY): Payer: Self-pay

## 2018-03-24 ENCOUNTER — Emergency Department (HOSPITAL_COMMUNITY)
Admission: EM | Admit: 2018-03-24 | Discharge: 2018-03-24 | Disposition: A | Discharge status: Home / Self Care | Payer: Self-pay | Attending: Emergency Medicine | Admitting: Emergency Medicine

## 2018-03-24 ENCOUNTER — Emergency Department (HOSPITAL_COMMUNITY): Payer: Self-pay

## 2018-03-24 DIAGNOSIS — R531 Weakness: Secondary | ICD-10-CM | POA: Insufficient documentation

## 2018-03-24 DIAGNOSIS — F17210 Nicotine dependence, cigarettes, uncomplicated: Secondary | ICD-10-CM | POA: Insufficient documentation

## 2018-03-24 DIAGNOSIS — F19920 Other psychoactive substance use, unspecified with intoxication, uncomplicated: Secondary | ICD-10-CM | POA: Insufficient documentation

## 2018-03-24 LAB — COMPREHENSIVE METABOLIC PANEL
ALBUMIN: 3.6 g/dL (ref 3.5–5.0)
ALK PHOS: 60 U/L (ref 38–126)
ALT: 28 U/L (ref 0–44)
ANION GAP: 10 (ref 5–15)
AST: 34 U/L (ref 15–41)
BILIRUBIN TOTAL: 0.9 mg/dL (ref 0.3–1.2)
BUN: 20 mg/dL (ref 6–20)
CALCIUM: 8.9 mg/dL (ref 8.9–10.3)
CO2: 21 mmol/L — AB (ref 22–32)
CREATININE: 0.93 mg/dL (ref 0.61–1.24)
Chloride: 107 mmol/L (ref 98–111)
GFR calc Af Amer: >60 mL/min (ref >60)
GFR calc non Af Amer: >60 mL/min (ref >60)
GLUCOSE: 70 mg/dL (ref 70–99)
Potassium: 3.5 mmol/L (ref 3.5–5.1)
SODIUM: 138 mmol/L (ref 135–145)
TOTAL PROTEIN: 6.7 g/dL (ref 6.5–8.1)

## 2018-03-24 LAB — CBC
HEMATOCRIT: 39.8 % (ref 39.0–52.0)
HEMOGLOBIN: 13 g/dL (ref 13.0–17.0)
MCH: 32.3 pg (ref 26.0–34.0)
MCHC: 32.7 g/dL (ref 30.0–36.0)
MCV: 99 fL (ref 78.0–100.0)
Platelets: 301 10*3/uL (ref 150–400)
RBC: 4.02 MIL/uL — AB (ref 4.22–5.81)
RDW: 13.2 % (ref 11.5–15.5)
WBC: 3.9 10*3/uL — AB (ref 4.0–10.5)

## 2018-03-24 LAB — LIPASE, BLOOD: Lipase: 26 U/L (ref 11–51)

## 2018-03-24 LAB — ACETAMINOPHEN LEVEL: Acetaminophen (Tylenol), Serum: <10 ug/mL — ABNORMAL LOW (ref 10–30)

## 2018-03-24 LAB — I-STAT TROPONIN, ED: Troponin i, poc: 0.03 ng/mL (ref 0.00–0.08)

## 2018-03-24 LAB — ETHANOL: Alcohol, Ethyl (B): <10 mg/dL (ref <10)

## 2018-03-24 LAB — I-STAT CG4 LACTIC ACID, ED: LACTIC ACID, VENOUS: 0.64 mmol/L (ref 0.5–1.9)

## 2018-03-24 LAB — SALICYLATE LEVEL: Salicylate Lvl: <7 mg/dL (ref 2.8–30.0)

## 2018-03-24 MED ORDER — SODIUM CHLORIDE 0.9 % IV BOLUS
1000.0000 mL | Freq: Once | INTRAVENOUS | Status: AC
  Administered 2018-03-24: 1000 mL via INTRAVENOUS

## 2018-03-24 MED ORDER — ONDANSETRON HCL 4 MG/2ML IJ SOLN
4.0000 mg | Freq: Once | INTRAMUSCULAR | Status: AC
  Administered 2018-03-24: 4 mg via INTRAVENOUS
  Filled 2018-03-24: qty 2

## 2018-03-24 NOTE — ED Provider Notes (Signed)
[facility] Emergency Department Provider Note MRN:  [id number]  Arrival date & time: 03/24/18     Chief Complaint   Altered mental status History of Present Illness   Omar Nelson is a 47 y.o. year-old male with a history of asthma presenting to the ED with chief complaint of altered mental status.  Patient was acting his normal self earlier this morning.  Was later found by his sister very somnolent, complaining of generalized weakness, malaise.  Brought here for evaluation.  Patient denies headache or vision change, no nausea or vomiting, endorsing pain but cannot specify where, admits to drinking alcohol today, denies any other drugs.  I was unable to obtain an accurate HPI, PMH, or ROS due to the patient's altered mental status.  Review of Systems  Positive for altered mental status, weakness, malaise.  Patient's Health History    Past Medical History:  Diagnosis Date  . Asthma   . Hernia, abdominal     History reviewed. No pertinent surgical history.  History reviewed. No pertinent family history.  Social History   Socioeconomic History  . Marital status: Legally Separated    Spouse name: Not on file  . Number of children: Not on file  . Years of education: Not on file  . Highest education level: Not on file  Occupational History  . Not on file  Social Needs  . Financial resource strain: Not on file  . Food insecurity:    Worry: Not on file    Inability: Not on file  . Transportation needs:    Medical: Not on file    Non-medical: Not on file  Tobacco Use  . Smoking status: Current Every Day Smoker  . Smokeless tobacco: Never Used  Substance and Sexual Activity  . Alcohol use: No  . Drug use: No  . Sexual activity: Not on file  Lifestyle  . Physical activity:    Days per week: Not on file    Minutes per session: Not on file  . Stress: Not on file  Relationships  . Social connections:    Talks on phone: Not on file    Gets  together: Not on file    Attends religious service: Not on file    Active member of club or organization: Not on file    Attends meetings of clubs or organizations: Not on file    Relationship status: Not on file  . Intimate partner violence:    Fear of current or ex partner: Not on file    Emotionally abused: Not on file    Physically abused: Not on file    Forced sexual activity: Not on file  Other Topics Concern  . Not on file  Social History Narrative  . Not on file     Physical Exam  Vital Signs and Nursing Notes reviewed Vitals:   03/24/18 1700 03/24/18 1830  BP: 137/85 (!) 130/92  Pulse: 66 65  Resp: 16 17  Temp:    SpO2: 100% 100%    CONSTITUTIONAL: Ill-appearing, NAD NEURO: Somnolent but wakes to voice, moving all extremities, following commands EYES:  eyes equal and reactive ENT/NECK:  no LAD, no JVD CARDIO: Regular rate, well-perfused, normal S1 and S2 PULM:  CTAB no wheezing or rhonchi GI/GU:  normal bowel sounds, non-distended, non-tender MSK/SPINE:  No gross deformities, no edema SKIN:  no rash, atraumatic PSYCH:  Appropriate speech and behavior  Diagnostic and Interventional Summary    EKG Interpretation  Date/Time:  Ventricular Rate:    PR Interval:    QRS Duration:   QT Interval:    QTC Calculation:   R Axis:     Text Interpretation:        Labs Reviewed  CBC - Abnormal; Notable for the following components:      Result Value   WBC 3.9 (*)    RBC 4.02 (*)    All other components within normal limits  COMPREHENSIVE METABOLIC PANEL - Abnormal; Notable for the following components:   CO2 21 (*)    All other components within normal limits  ACETAMINOPHEN LEVEL - Abnormal; Notable for the following components:   Acetaminophen (Tylenol), Serum <10 (*)    All other components within normal limits  LIPASE, BLOOD  ETHANOL  SALICYLATE LEVEL  URINALYSIS, ROUTINE W REFLEX MICROSCOPIC  RAPID URINE DRUG SCREEN, HOSP PERFORMED  I-STAT TROPONIN,  ED  I-STAT CG4 LACTIC ACID, ED    CT HEAD WO CONTRAST  Final Result      Medications  sodium chloride 0.9 % bolus 1,000 mL (0 mLs Intravenous Stopped 03/24/18 2002)  ondansetron (ZOFRAN) injection 4 mg (4 mg Intravenous Given 03/24/18 1633)     Procedures Critical Care  ED Course and Medical Decision Making  I have reviewed the triage vital signs and the nursing notes.  Pertinent labs & imaging results that were available during my care of the patient were reviewed by me and considered in my medical decision making (see below for details). Clinical Course as of Mar 24 2206  Mon Mar 24, 2018  1621 History of polysubstance abuse per chart review.   [MB]    Clinical Course User Index [MB] Sabas Sous, MD    Unclear etiology of this patient's altered mental status, fatigue, malaise.  Question of simple ethanol toxicity versus metabolic disarray, intracranial process.  Labs, CT pending.  Work-up unremarkable, patient has returned to baseline mental function, eating in the emergency department ambulating without issue, at times verbally abusive to staff.  Suspecting intoxication, unknown substance.  Appropriate for discharge at this time.  After the discussed management above, the patient was determined to be safe for discharge.  The patient was in agreement with this plan and all questions regarding their care were answered.  ED return precautions were discussed and the patient will return to the ED with any significant worsening of condition.  Elmer Sow. Pilar Plate, MD [facility] Emergency Medicine [facility] [email or web address]  Final Clinical Impressions(s) / ED Diagnoses     ICD-10-CM   1. Weakness R53.1   2. Drug intoxication without complication [facility]) [id number]     ED Discharge Orders    None         Sabas Sous, MD 03/24/18 2208

## 2018-03-24 NOTE — ED Notes (Signed)
Pt calling out and yelling. Went into Pt's room. Pt yelling and stating that this nurse neglected him. Apologized for Pt's wait on 2nd sandwich. Pt agitated, yelling and threatening this nurse at this time. Apologized again. Pt stated that he did not want me to touch him. Notified Dr. Virgel Bouquet

## 2018-03-24 NOTE — ED Notes (Signed)
Went over discharge instructions again with Pt.

## 2018-03-24 NOTE — ED Notes (Signed)
Provided Pt with drink and sandwich. Asked Pt if there was anything else I could do for him at this time. Pt denied

## 2018-03-24 NOTE — Discharge Instructions (Signed)
You were evaluated in the Emergency Department and after careful evaluation, we did not find any emergent condition requiring admission or further testing in the hospital. ° °Please return to the Emergency Department if you experience any worsening of your condition.  We encourage you to follow up with a primary care provider.  Thank you for allowing us to be a part of your care. °

## 2018-03-24 NOTE — ED Notes (Signed)
Went to take out Pt's IV. Pt yelling and threatening. Pt refused for this nurse to take out IV. Stated that I was incompetent. Asked tech to removed Pt's IV. IV removed, security at bedside. Attempted to go over discharge instructions and Pt refused to listen. Pt refused to sign discharge paperwork.

## 2018-03-24 NOTE — ED Triage Notes (Signed)
Pt presents with c/o not feeling well. Pt reports his legs have been cramping, possible dehydration symptoms. Pt reports his whole right side is hurting and he simply doesn't feel good.

## 2018-03-24 NOTE — ED Notes (Signed)
ED Provider at bedside. 
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
## 2018-03-25 NOTE — Care Management Note (Signed)
Case Management Note  CM consulted for "future review."  CM reviewed chart and saw no pcp and no ins.  No information for follow up with placed on AVS by provider.  No obvious notes for CM consult.  CM called pt at home phone number with no answer and no way to leave a VM.  CM will follow up with pt if he returns to the ED.  Teryn Boerema, Lynnae Sandhoff, RN 03/25/2018, 9:08 AM

## 2018-06-05 ENCOUNTER — Emergency Department (HOSPITAL_COMMUNITY)
Admission: EM | Admit: 2018-06-05 | Discharge: 2018-06-05 | Disposition: A | Discharge status: Home / Self Care | Payer: Self-pay | Attending: Emergency Medicine | Admitting: Emergency Medicine

## 2018-06-05 ENCOUNTER — Other Ambulatory Visit: Payer: Self-pay

## 2018-06-05 ENCOUNTER — Encounter (HOSPITAL_COMMUNITY): Payer: Self-pay | Admitting: Emergency Medicine

## 2018-06-05 DIAGNOSIS — J45909 Unspecified asthma, uncomplicated: Secondary | ICD-10-CM | POA: Insufficient documentation

## 2018-06-05 DIAGNOSIS — F17200 Nicotine dependence, unspecified, uncomplicated: Secondary | ICD-10-CM | POA: Insufficient documentation

## 2018-06-05 DIAGNOSIS — Y92003 Bedroom of unspecified non-institutional (private) residence as the place of occurrence of the external cause: Secondary | ICD-10-CM | POA: Insufficient documentation

## 2018-06-05 DIAGNOSIS — X58XXXA Exposure to other specified factors, initial encounter: Secondary | ICD-10-CM | POA: Insufficient documentation

## 2018-06-05 DIAGNOSIS — Y9389 Activity, other specified: Secondary | ICD-10-CM | POA: Insufficient documentation

## 2018-06-05 DIAGNOSIS — Y999 Unspecified external cause status: Secondary | ICD-10-CM | POA: Insufficient documentation

## 2018-06-05 DIAGNOSIS — K4090 Unilateral inguinal hernia, without obstruction or gangrene, not specified as recurrent: Secondary | ICD-10-CM | POA: Insufficient documentation

## 2018-06-05 DIAGNOSIS — S39012A Strain of muscle, fascia and tendon of lower back, initial encounter: Secondary | ICD-10-CM | POA: Diagnosis present

## 2018-06-05 MED ORDER — CYCLOBENZAPRINE HCL 10 MG PO TABS
10.0000 mg | ORAL_TABLET | Freq: Once | ORAL | Status: AC
  Administered 2018-06-05: 10 mg via ORAL

## 2018-06-05 MED ORDER — CYCLOBENZAPRINE HCL 10 MG PO TABS
5.0000 mg | ORAL_TABLET | Freq: Once | ORAL | Status: DC
  Filled 2018-06-05: qty 1

## 2018-06-05 MED ORDER — IBUPROFEN 400 MG PO TABS
400.0000 mg | ORAL_TABLET | Freq: Once | ORAL | Status: AC
  Administered 2018-06-05: 400 mg via ORAL
  Filled 2018-06-05: qty 1

## 2018-06-05 MED ORDER — CYCLOBENZAPRINE HCL 10 MG PO TABS: 10.0000 mg | ORAL_TABLET | Freq: Three times a day (TID) | ORAL | 0 refills | Status: AC | PRN

## 2018-06-05 NOTE — ED Notes (Signed)
Discharge instructions and prescription discussed with Pt. Pt verbalized understanding. Pt stable and ambulatory.    

## 2018-06-05 NOTE — Discharge Instructions (Addendum)
Take Tylenol or Advil as directed for back pain.  Take the muscle relaxer prescribed for muscle spasm.  Call the number on these instructions today to to get a primary care physician.  Call [facility] to arrange to get your hernia repaired.
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
## 2018-06-05 NOTE — ED Triage Notes (Signed)
Pt c/o lower back pain x 2 days, unsure what caused it but thinks it may be from working at the [employer].

## 2018-12-21 ENCOUNTER — Encounter (HOSPITAL_COMMUNITY): Payer: Self-pay | Admitting: Emergency Medicine

## 2018-12-21 ENCOUNTER — Emergency Department (HOSPITAL_COMMUNITY)
Admission: EM | Admit: 2018-12-21 | Discharge: 2018-12-22 | Disposition: A | Discharge status: Home / Self Care | Payer: Self-pay | Attending: Emergency Medicine | Admitting: Emergency Medicine

## 2018-12-21 ENCOUNTER — Emergency Department (HOSPITAL_COMMUNITY): Payer: Self-pay

## 2018-12-21 DIAGNOSIS — R109 Unspecified abdominal pain: Secondary | ICD-10-CM | POA: Insufficient documentation

## 2018-12-21 DIAGNOSIS — Y998 Other external cause status: Secondary | ICD-10-CM | POA: Insufficient documentation

## 2018-12-21 DIAGNOSIS — Y9289 Other specified places as the place of occurrence of the external cause: Secondary | ICD-10-CM | POA: Insufficient documentation

## 2018-12-21 DIAGNOSIS — Y9389 Activity, other specified: Secondary | ICD-10-CM | POA: Insufficient documentation

## 2018-12-21 DIAGNOSIS — T1490XA Injury, unspecified, initial encounter: Secondary | ICD-10-CM

## 2018-12-21 DIAGNOSIS — F10929 Alcohol use, unspecified with intoxication, unspecified: Secondary | ICD-10-CM | POA: Insufficient documentation

## 2018-12-21 DIAGNOSIS — S0990XA Unspecified injury of head, initial encounter: Secondary | ICD-10-CM | POA: Insufficient documentation

## 2018-12-21 DIAGNOSIS — S299XXA Unspecified injury of thorax, initial encounter: Secondary | ICD-10-CM | POA: Insufficient documentation

## 2018-12-21 DIAGNOSIS — S0993XA Unspecified injury of face, initial encounter: Secondary | ICD-10-CM | POA: Insufficient documentation

## 2018-12-21 LAB — I-STAT CHEM 8, ED
BUN: 12 mg/dL (ref 6–20)
Calcium, Ion: 1.02 mmol/L — ABNORMAL LOW (ref 1.15–1.40)
Chloride: 112 mmol/L — ABNORMAL HIGH (ref 98–111)
Creatinine, Ser: 1.6 mg/dL — ABNORMAL HIGH (ref 0.61–1.24)
Glucose, Bld: 81 mg/dL (ref 70–99)
HCT: 45 % (ref 39.0–52.0)
Hemoglobin: 15.3 g/dL (ref 13.0–17.0)
Potassium: 4.5 mmol/L (ref 3.5–5.1)
Sodium: 143 mmol/L (ref 135–145)
TCO2: 25 mmol/L (ref 22–32)

## 2018-12-21 MED ORDER — LORAZEPAM 2 MG/ML IJ SOLN
1.0000 mg | Freq: Once | INTRAMUSCULAR | Status: AC
  Administered 2018-12-21: 22:00:00 1 mg via INTRAVENOUS
  Filled 2018-12-21: qty 1

## 2018-12-21 MED ORDER — HALOPERIDOL LACTATE 5 MG/ML IJ SOLN
5.0000 mg | Freq: Once | INTRAMUSCULAR | Status: AC
  Administered 2018-12-21: 5 mg via INTRAVENOUS

## 2018-12-21 MED ORDER — HALOPERIDOL LACTATE 5 MG/ML IJ SOLN
5.0000 mg | Freq: Once | INTRAMUSCULAR | Status: DC
  Filled 2018-12-21: qty 1
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
## 2018-12-21 NOTE — ED Notes (Signed)
Pt becoming increasingly agitated, yelling and cussing at staff. Pt refusing care, states we aren't helping him. EDPs aware. Security at bedside.

## 2018-12-21 NOTE — ED Provider Notes (Signed)
ATTENDING SUPERVISORY NOTE I have personally viewed the imaging studies performed. I have personally seen and examined the patient, and discussed the plan of care with the resident.  I have reviewed the documentation of the resident and agree.  Trauma - Plan: DG Chest Port 1 [address], DG Chest [place] [address] [place], [place] Pelvis Portable, [place] Pelvis Portable  .Critical Care Performed by: Elnora Morrison, MD Authorized by: Elnora Morrison, MD   Critical care provider statement:    Critical care time (minutes):  40   Critical care start time:  12/21/2018 9:40 PM   Critical care end time:  12/21/2018 10:20 PM   Critical care time was exclusive of:  Separately billable procedures and treating other patients   Critical care was necessary to treat or prevent imminent or life-threatening deterioration of the following conditions:  Trauma   Critical care was time spent personally by me on the following activities:  Discussions with consultants, evaluation of patient's response to treatment, examination of patient, ordering and performing treatments and interventions, ordering and review of laboratory studies, ordering and review of radiographic studies, pulse oximetry, re-evaluation of patient's condition, obtaining history from patient or surrogate and review of old charts     Elnora Morrison, MD 12/21/18 2350

## 2018-12-22 ENCOUNTER — Emergency Department (HOSPITAL_COMMUNITY): Payer: Self-pay

## 2018-12-22 LAB — CBC
HCT: 43.8 % (ref 39.0–52.0)
Hemoglobin: 13.8 g/dL (ref 13.0–17.0)
MCH: 32.5 pg (ref 26.0–34.0)
MCHC: 31.5 g/dL (ref 30.0–36.0)
MCV: 103.1 fL — ABNORMAL HIGH (ref 80.0–100.0)
Platelets: 280 10*3/uL (ref 150–400)
RBC: 4.25 MIL/uL (ref 4.22–5.81)
RDW: 13.2 % (ref 11.5–15.5)
WBC: 6 10*3/uL (ref 4.0–10.5)
nRBC: 0 % (ref 0.0–0.2)

## 2018-12-22 LAB — URINALYSIS, ROUTINE W REFLEX MICROSCOPIC
Bilirubin Urine: NEGATIVE
Glucose, UA: NEGATIVE mg/dL
Hgb urine dipstick: NEGATIVE
Ketones, ur: NEGATIVE mg/dL
Leukocytes,Ua: NEGATIVE
Nitrite: NEGATIVE
Protein, ur: NEGATIVE mg/dL
Specific Gravity, Urine: 1.006 (ref 1.005–1.030)
pH: 6 (ref 5.0–8.0)

## 2018-12-22 LAB — ETHANOL: Alcohol, Ethyl (B): 228 mg/dL — ABNORMAL HIGH (ref <10)

## 2018-12-22 LAB — COMPREHENSIVE METABOLIC PANEL
ALT: 41 U/L (ref 0–44)
AST: 66 U/L — ABNORMAL HIGH (ref 15–41)
Albumin: 4.2 g/dL (ref 3.5–5.0)
Alkaline Phosphatase: 69 U/L (ref 38–126)
Anion gap: 12 (ref 5–15)
BUN: 11 mg/dL (ref 6–20)
CO2: 21 mmol/L — ABNORMAL LOW (ref 22–32)
Calcium: 9 mg/dL (ref 8.9–10.3)
Chloride: 109 mmol/L (ref 98–111)
Creatinine, Ser: 1.28 mg/dL — ABNORMAL HIGH (ref 0.61–1.24)
GFR calc Af Amer: >60 mL/min (ref >60)
GFR calc non Af Amer: >60 mL/min (ref >60)
Glucose, Bld: 82 mg/dL (ref 70–99)
Potassium: 4.8 mmol/L (ref 3.5–5.1)
Sodium: 142 mmol/L (ref 135–145)
Total Bilirubin: 0.8 mg/dL (ref 0.3–1.2)
Total Protein: 7.6 g/dL (ref 6.5–8.1)

## 2018-12-22 LAB — SAMPLE TO BLOOD BANK

## 2018-12-22 LAB — PROTIME-INR
INR: 1.2 (ref 0.8–1.2)
Prothrombin Time: 14.6 seconds (ref 11.4–15.2)

## 2018-12-22 LAB — LACTIC ACID, PLASMA: Lactic Acid, Venous: 3.4 mmol/L (ref 0.5–1.9)

## 2018-12-22 MED ORDER — SODIUM CHLORIDE 0.9 % IV BOLUS
2000.0000 mL | Freq: Once | INTRAVENOUS | Status: AC
  Administered 2018-12-22: 2000 mL via INTRAVENOUS

## 2018-12-22 MED ORDER — IOHEXOL 300 MG/ML  SOLN
100.0000 mL | Freq: Once | INTRAMUSCULAR | Status: AC | PRN
  Administered 2018-12-22: 100 mL via INTRAVENOUS

## 2018-12-22 MED ORDER — LIDOCAINE 5 % EX PTCH: 1.0000 | MEDICATED_PATCH | CUTANEOUS | 0 refills | Status: AC

## 2018-12-22 MED FILL — LIDOCAINE PATCH 5%: 5 | 10 days supply | Qty: 10 | Fill #0

## 2018-12-22 NOTE — ED Notes (Signed)
Pt resting.

## 2018-12-22 NOTE — ED Notes (Signed)
Pt voided in floor

## 2018-12-22 NOTE — ED Notes (Signed)
Pt refusing blood work 

## 2019-01-03 ENCOUNTER — Emergency Department (HOSPITAL_COMMUNITY)
Admission: EM | Admit: 2019-01-03 | Discharge: 2019-01-03 | Disposition: A | Discharge status: Home / Self Care | Payer: Self-pay | Attending: Emergency Medicine | Admitting: Emergency Medicine

## 2019-01-03 DIAGNOSIS — F17200 Nicotine dependence, unspecified, uncomplicated: Secondary | ICD-10-CM | POA: Insufficient documentation

## 2019-01-03 DIAGNOSIS — Z202 Contact with and (suspected) exposure to infections with a predominantly sexual mode of transmission: Secondary | ICD-10-CM | POA: Insufficient documentation

## 2019-01-03 DIAGNOSIS — J45909 Unspecified asthma, uncomplicated: Secondary | ICD-10-CM | POA: Insufficient documentation

## 2019-01-03 MED ORDER — CEFTRIAXONE SODIUM 250 MG IJ SOLR
250.0000 mg | Freq: Once | INTRAMUSCULAR | Status: AC
  Administered 2019-01-03: 250 mg via INTRAMUSCULAR
  Filled 2019-01-03: qty 250

## 2019-01-03 MED ORDER — AZITHROMYCIN 250 MG PO TABS
1000.0000 mg | ORAL_TABLET | Freq: Once | ORAL | Status: AC
  Administered 2019-01-03: 1000 mg via ORAL
  Filled 2019-01-03: qty 4
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
## 2019-01-05 LAB — GC/CHLAMYDIA PROBE AMP (~~LOC~~) NOT AT ARMC
Chlamydia: NEGATIVE
Neisseria Gonorrhea: POSITIVE — AB
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
## 2020-05-16 DEATH — deceased
